# Patient Record
Sex: Female | Born: 1975 | Race: White | Hispanic: No | Marital: Married | Smoking: Never smoker
Health system: Southern US, Community
[De-identification: ages and names within clinical notes are randomized; demographics above are authoritative.]

## PROBLEM LIST (undated history)

## (undated) DIAGNOSIS — N912 Amenorrhea, unspecified: Secondary | ICD-10-CM

## (undated) DIAGNOSIS — R51 Headache: Secondary | ICD-10-CM

## (undated) DIAGNOSIS — F411 Generalized anxiety disorder: Secondary | ICD-10-CM

## (undated) DIAGNOSIS — R42 Dizziness and giddiness: Secondary | ICD-10-CM

## (undated) DIAGNOSIS — J069 Acute upper respiratory infection, unspecified: Secondary | ICD-10-CM

## (undated) DIAGNOSIS — R002 Palpitations: Secondary | ICD-10-CM

## (undated) DIAGNOSIS — K645 Perianal venous thrombosis: Secondary | ICD-10-CM

## (undated) DIAGNOSIS — E039 Hypothyroidism, unspecified: Secondary | ICD-10-CM

## (undated) DIAGNOSIS — E876 Hypokalemia: Secondary | ICD-10-CM

## (undated) DIAGNOSIS — I10 Essential (primary) hypertension: Secondary | ICD-10-CM

## (undated) DIAGNOSIS — F329 Major depressive disorder, single episode, unspecified: Secondary | ICD-10-CM

## (undated) HISTORY — DX: Headache: R51

## (undated) HISTORY — DX: Perianal venous thrombosis: K64.5

## (undated) HISTORY — DX: Hypothyroidism, unspecified: E03.9

## (undated) HISTORY — DX: Palpitations: R00.2

## (undated) HISTORY — DX: Acute upper respiratory infection, unspecified: J06.9

## (undated) HISTORY — DX: Hypokalemia: E87.6

## (undated) HISTORY — PX: OTHER SURGICAL HISTORY: SHX169

## (undated) HISTORY — DX: Essential (primary) hypertension: I10

## (undated) HISTORY — DX: Dizziness and giddiness: R42

## (undated) HISTORY — DX: Amenorrhea, unspecified: N91.2

## (undated) HISTORY — DX: Generalized anxiety disorder: F41.1

## (undated) HISTORY — DX: Major depressive disorder, single episode, unspecified: F32.9

---

## 1993-11-15 HISTORY — PX: TONSILLECTOMY: SUR1361

## 2002-09-12 ENCOUNTER — Other Ambulatory Visit: Admission: RE | Admit: 2002-09-12 | Discharge: 2002-09-12 | Payer: Self-pay | Admitting: Obstetrics and Gynecology

## 2002-12-27 ENCOUNTER — Ambulatory Visit (HOSPITAL_COMMUNITY): Admission: RE | Admit: 2002-12-27 | Discharge: 2002-12-27 | Payer: Self-pay | Admitting: Obstetrics and Gynecology

## 2002-12-27 ENCOUNTER — Encounter: Payer: Self-pay | Admitting: Obstetrics and Gynecology

## 2003-03-20 ENCOUNTER — Inpatient Hospital Stay (HOSPITAL_COMMUNITY): Admission: AD | Admit: 2003-03-20 | Discharge: 2003-03-20 | Payer: Self-pay | Admitting: Obstetrics and Gynecology

## 2003-03-29 ENCOUNTER — Inpatient Hospital Stay (HOSPITAL_COMMUNITY): Admission: AD | Admit: 2003-03-29 | Discharge: 2003-03-31 | Payer: Self-pay | Admitting: *Deleted

## 2003-03-31 ENCOUNTER — Inpatient Hospital Stay (HOSPITAL_COMMUNITY): Admission: AD | Admit: 2003-03-31 | Discharge: 2003-03-31 | Payer: Self-pay | Admitting: *Deleted

## 2003-04-25 ENCOUNTER — Other Ambulatory Visit: Admission: RE | Admit: 2003-04-25 | Discharge: 2003-04-25 | Payer: Self-pay | Admitting: Obstetrics and Gynecology

## 2003-04-30 ENCOUNTER — Encounter: Admission: RE | Admit: 2003-04-30 | Discharge: 2003-05-30 | Payer: Self-pay | Admitting: Obstetrics and Gynecology

## 2007-04-11 ENCOUNTER — Encounter: Payer: Self-pay | Admitting: Endocrinology

## 2007-07-20 ENCOUNTER — Encounter: Payer: Self-pay | Admitting: Endocrinology

## 2007-10-10 ENCOUNTER — Encounter: Payer: Self-pay | Admitting: Endocrinology

## 2007-11-29 ENCOUNTER — Encounter: Payer: Self-pay | Admitting: Endocrinology

## 2007-12-09 ENCOUNTER — Encounter: Payer: Self-pay | Admitting: Endocrinology

## 2008-01-04 ENCOUNTER — Encounter: Payer: Self-pay | Admitting: Endocrinology

## 2008-01-11 ENCOUNTER — Ambulatory Visit: Payer: Self-pay | Admitting: Endocrinology

## 2008-01-11 DIAGNOSIS — F329 Major depressive disorder, single episode, unspecified: Secondary | ICD-10-CM

## 2008-01-11 DIAGNOSIS — E039 Hypothyroidism, unspecified: Secondary | ICD-10-CM | POA: Insufficient documentation

## 2008-01-11 DIAGNOSIS — F3289 Other specified depressive episodes: Secondary | ICD-10-CM | POA: Insufficient documentation

## 2008-01-11 DIAGNOSIS — I10 Essential (primary) hypertension: Secondary | ICD-10-CM | POA: Insufficient documentation

## 2008-01-11 HISTORY — DX: Essential (primary) hypertension: I10

## 2008-01-11 HISTORY — DX: Major depressive disorder, single episode, unspecified: F32.9

## 2008-01-11 HISTORY — DX: Other specified depressive episodes: F32.89

## 2008-01-11 HISTORY — DX: Hypothyroidism, unspecified: E03.9

## 2008-02-05 ENCOUNTER — Ambulatory Visit: Payer: Self-pay | Admitting: Endocrinology

## 2008-02-06 LAB — CONVERTED CEMR LAB
BUN: 7 mg/dL (ref 6–23)
CO2: 29 meq/L (ref 19–32)
Calcium: 9 mg/dL (ref 8.4–10.5)
Chloride: 106 meq/L (ref 96–112)
Creatinine, Ser: 0.6 mg/dL (ref 0.4–1.2)
GFR calc Af Amer: 148 mL/min
GFR calc non Af Amer: 122 mL/min
Glucose, Bld: 92 mg/dL (ref 70–99)
Potassium: 4.3 meq/L (ref 3.5–5.1)
Sodium: 143 meq/L (ref 135–145)
TSH: 2.34 microintl units/mL (ref 0.35–5.50)

## 2008-03-26 ENCOUNTER — Ambulatory Visit: Payer: Self-pay | Admitting: Endocrinology

## 2008-03-29 ENCOUNTER — Telehealth: Payer: Self-pay | Admitting: Endocrinology

## 2008-03-29 ENCOUNTER — Ambulatory Visit: Payer: Self-pay | Admitting: Endocrinology

## 2008-03-29 LAB — CONVERTED CEMR LAB
BUN: 8 mg/dL (ref 6–23)
CO2: 30 meq/L (ref 19–32)
Calcium: 8.8 mg/dL (ref 8.4–10.5)
Chloride: 109 meq/L (ref 96–112)
Creatinine, Ser: 0.8 mg/dL (ref 0.4–1.2)
Free T4: 1.1 ng/dL (ref 0.6–1.6)
GFR calc Af Amer: 106 mL/min
GFR calc non Af Amer: 88 mL/min
Glucose, Bld: 79 mg/dL (ref 70–99)
Potassium: 4.4 meq/L (ref 3.5–5.1)
Sodium: 143 meq/L (ref 135–145)
TSH: 1.37 microintl units/mL (ref 0.35–5.50)

## 2008-04-02 ENCOUNTER — Ambulatory Visit: Payer: Self-pay | Admitting: Endocrinology

## 2008-04-02 DIAGNOSIS — R51 Headache: Secondary | ICD-10-CM | POA: Insufficient documentation

## 2008-04-02 DIAGNOSIS — R519 Headache, unspecified: Secondary | ICD-10-CM | POA: Insufficient documentation

## 2008-04-02 DIAGNOSIS — R42 Dizziness and giddiness: Secondary | ICD-10-CM

## 2008-04-02 DIAGNOSIS — R002 Palpitations: Secondary | ICD-10-CM | POA: Insufficient documentation

## 2008-04-02 HISTORY — DX: Dizziness and giddiness: R42

## 2008-04-02 HISTORY — DX: Headache: R51

## 2008-04-02 HISTORY — DX: Palpitations: R00.2

## 2008-04-11 ENCOUNTER — Encounter: Payer: Self-pay | Admitting: Endocrinology

## 2008-04-11 ENCOUNTER — Ambulatory Visit: Payer: Self-pay

## 2008-04-12 ENCOUNTER — Telehealth: Payer: Self-pay | Admitting: Endocrinology

## 2008-04-15 ENCOUNTER — Telehealth: Payer: Self-pay | Admitting: Endocrinology

## 2008-04-17 ENCOUNTER — Encounter: Payer: Self-pay | Admitting: Endocrinology

## 2008-04-18 ENCOUNTER — Telehealth: Payer: Self-pay | Admitting: Internal Medicine

## 2008-04-20 LAB — CONVERTED CEMR LAB
Catecholamines Tot(E+NE) 24 Hr U: 0.052 mg/24hr
Dopamine 24 Hr Urine: 276 mcg/24hr (ref <500)
Epinephrine 24 Hr Urine: 3 mcg/24hr (ref <20)
Metaneph Total, Ur: 374 ug/24hr (ref 115–695)
Metanephrines, Ur: 142 (ref 36–190)
Norepinephrine 24 Hr Urine: 49 mcg/24hr (ref <80)
Normetanephrine, 24H Ur: 232 (ref 35–482)

## 2008-04-22 ENCOUNTER — Ambulatory Visit: Payer: Self-pay | Admitting: Endocrinology

## 2008-04-22 DIAGNOSIS — E876 Hypokalemia: Secondary | ICD-10-CM

## 2008-04-22 HISTORY — DX: Hypokalemia: E87.6

## 2008-04-30 LAB — CONVERTED CEMR LAB
Aldosterone, Serum: 2
PRA: 0.4

## 2008-05-06 ENCOUNTER — Encounter: Payer: Self-pay | Admitting: Endocrinology

## 2008-05-16 ENCOUNTER — Ambulatory Visit: Payer: Self-pay | Admitting: Endocrinology

## 2008-05-16 ENCOUNTER — Encounter (INDEPENDENT_AMBULATORY_CARE_PROVIDER_SITE_OTHER): Payer: Self-pay | Admitting: *Deleted

## 2008-06-21 ENCOUNTER — Ambulatory Visit: Payer: Self-pay | Admitting: Internal Medicine

## 2008-06-21 ENCOUNTER — Encounter: Payer: Self-pay | Admitting: Endocrinology

## 2008-07-26 ENCOUNTER — Ambulatory Visit: Payer: Self-pay | Admitting: Internal Medicine

## 2008-10-16 ENCOUNTER — Ambulatory Visit: Payer: Self-pay | Admitting: Internal Medicine

## 2008-10-17 LAB — CONVERTED CEMR LAB
ALT: 12 units/L (ref 0–35)
Basophils Absolute: 0.1 10*3/uL (ref 0.0–0.1)
Bilirubin Urine: NEGATIVE
Bilirubin, Direct: 0.1 mg/dL (ref 0.0–0.3)
CO2: 29 meq/L (ref 19–32)
Calcium: 8.9 mg/dL (ref 8.4–10.5)
Chloride: 106 meq/L (ref 96–112)
Creatinine, Ser: 0.7 mg/dL (ref 0.4–1.2)
Eosinophils Absolute: 0.1 10*3/uL (ref 0.0–0.7)
GFR calc non Af Amer: 102 mL/min
HDL: 47.1 mg/dL (ref >39.0)
Ketones, ur: NEGATIVE mg/dL
LDL Cholesterol: 106 mg/dL — ABNORMAL HIGH (ref 0–99)
Lymphocytes Relative: 23.4 % (ref 12.0–46.0)
MCHC: 34.6 g/dL (ref 30.0–36.0)
MCV: 89.5 fL (ref 78.0–100.0)
Mucus, UA: NEGATIVE
Neutrophils Relative %: 67.3 % (ref 43.0–77.0)
RDW: 12.2 % (ref 11.5–14.6)
Sodium: 141 meq/L (ref 135–145)
TSH: 1.72 microintl units/mL (ref 0.35–5.50)
Total Bilirubin: 0.8 mg/dL (ref 0.3–1.2)
Total CHOL/HDL Ratio: 3.5
Triglycerides: 63 mg/dL (ref 0–149)
Urobilinogen, UA: 0.2 (ref 0.0–1.0)
VLDL: 13 mg/dL (ref 0–40)

## 2008-10-18 ENCOUNTER — Ambulatory Visit: Payer: Self-pay | Admitting: Internal Medicine

## 2008-10-18 DIAGNOSIS — F411 Generalized anxiety disorder: Secondary | ICD-10-CM | POA: Insufficient documentation

## 2008-10-18 DIAGNOSIS — J069 Acute upper respiratory infection, unspecified: Secondary | ICD-10-CM

## 2008-10-18 DIAGNOSIS — N912 Amenorrhea, unspecified: Secondary | ICD-10-CM

## 2008-10-18 HISTORY — DX: Acute upper respiratory infection, unspecified: J06.9

## 2008-10-18 HISTORY — DX: Generalized anxiety disorder: F41.1

## 2008-10-18 HISTORY — DX: Amenorrhea, unspecified: N91.2

## 2008-10-18 LAB — CONVERTED CEMR LAB: Prolactin: 13.4 ng/mL

## 2008-11-05 LAB — CONVERTED CEMR LAB: Progesterone: 1.8 ng/mL

## 2008-12-06 ENCOUNTER — Telehealth: Payer: Self-pay | Admitting: Endocrinology

## 2009-06-12 ENCOUNTER — Ambulatory Visit: Payer: Self-pay | Admitting: Internal Medicine

## 2009-06-12 ENCOUNTER — Telehealth: Payer: Self-pay | Admitting: Internal Medicine

## 2009-06-12 LAB — CONVERTED CEMR LAB: TSH: 1.23 microintl units/mL (ref 0.35–5.50)

## 2009-06-17 ENCOUNTER — Ambulatory Visit: Payer: Self-pay | Admitting: Endocrinology

## 2009-08-01 ENCOUNTER — Encounter (INDEPENDENT_AMBULATORY_CARE_PROVIDER_SITE_OTHER): Payer: Self-pay | Admitting: *Deleted

## 2009-08-22 ENCOUNTER — Ambulatory Visit: Payer: Self-pay | Admitting: Endocrinology

## 2009-08-22 LAB — CONVERTED CEMR LAB: Vit D, 25-Hydroxy: 29 ng/mL — ABNORMAL LOW (ref 30–89)

## 2009-08-26 ENCOUNTER — Telehealth: Payer: Self-pay | Admitting: Endocrinology

## 2009-08-26 LAB — CONVERTED CEMR LAB
BUN: 10 mg/dL (ref 6–23)
Calcium: 9 mg/dL (ref 8.4–10.5)
Creatinine, Ser: 0.8 mg/dL (ref 0.4–1.2)
GFR calc non Af Amer: 86.74 mL/min (ref >60)
Glucose, Bld: 102 mg/dL — ABNORMAL HIGH (ref 70–99)
Potassium: 4.5 meq/L (ref 3.5–5.1)

## 2009-08-27 ENCOUNTER — Telehealth: Payer: Self-pay | Admitting: Endocrinology

## 2009-08-29 ENCOUNTER — Ambulatory Visit: Payer: Self-pay | Admitting: Internal Medicine

## 2009-08-29 DIAGNOSIS — K645 Perianal venous thrombosis: Secondary | ICD-10-CM | POA: Insufficient documentation

## 2009-08-29 HISTORY — DX: Perianal venous thrombosis: K64.5

## 2009-09-01 ENCOUNTER — Telehealth: Payer: Self-pay | Admitting: Internal Medicine

## 2009-09-05 ENCOUNTER — Encounter: Payer: Self-pay | Admitting: Internal Medicine

## 2009-09-09 ENCOUNTER — Encounter (INDEPENDENT_AMBULATORY_CARE_PROVIDER_SITE_OTHER): Payer: Self-pay | Admitting: *Deleted

## 2009-10-21 ENCOUNTER — Ambulatory Visit: Payer: Self-pay | Admitting: Internal Medicine

## 2009-10-21 LAB — CONVERTED CEMR LAB
ALT: 14 units/L (ref 0–35)
AST: 17 units/L (ref 0–37)
Albumin: 4 g/dL (ref 3.5–5.2)
BUN: 10 mg/dL (ref 6–23)
Basophils Absolute: 0 10*3/uL (ref 0.0–0.1)
Bilirubin Urine: NEGATIVE
CO2: 27 meq/L (ref 19–32)
Chloride: 107 meq/L (ref 96–112)
Cholesterol: 131 mg/dL (ref 0–200)
GFR calc non Af Amer: 86.66 mL/min (ref >60)
Glucose, Bld: 102 mg/dL — ABNORMAL HIGH (ref 70–99)
HCT: 44.2 % (ref 36.0–46.0)
Hemoglobin: 15.2 g/dL — ABNORMAL HIGH (ref 12.0–15.0)
Ketones, ur: NEGATIVE mg/dL
Leukocytes, UA: NEGATIVE
Lymphs Abs: 1.5 10*3/uL (ref 0.7–4.0)
MCV: 92 fL (ref 78.0–100.0)
Monocytes Absolute: 0.3 10*3/uL (ref 0.1–1.0)
Monocytes Relative: 6.4 % (ref 3.0–12.0)
Neutro Abs: 3.4 10*3/uL (ref 1.4–7.7)
Nitrite: NEGATIVE
Platelets: 173 10*3/uL (ref 150.0–400.0)
Potassium: 4.1 meq/L (ref 3.5–5.1)
RDW: 12.4 % (ref 11.5–14.6)
Sodium: 140 meq/L (ref 135–145)
Specific Gravity, Urine: >=1.03 (ref 1.000–1.030)
TSH: 1.04 microintl units/mL (ref 0.35–5.50)
Total Bilirubin: 1.1 mg/dL (ref 0.3–1.2)
Urobilinogen, UA: 0.2 (ref 0.0–1.0)

## 2009-10-24 ENCOUNTER — Ambulatory Visit: Payer: Self-pay | Admitting: Internal Medicine

## 2009-11-13 ENCOUNTER — Ambulatory Visit: Payer: Self-pay | Admitting: Internal Medicine

## 2010-02-10 ENCOUNTER — Encounter: Admission: RE | Admit: 2010-02-10 | Discharge: 2010-02-10 | Payer: Self-pay | Admitting: Family Medicine

## 2010-09-10 ENCOUNTER — Ambulatory Visit: Payer: Self-pay | Admitting: Internal Medicine

## 2010-10-19 ENCOUNTER — Encounter: Admission: RE | Admit: 2010-10-19 | Discharge: 2010-10-19 | Payer: Self-pay | Admitting: Endocrinology

## 2010-12-13 LAB — CONVERTED CEMR LAB: Sed Rate: 21 mm/hr (ref 0–22)

## 2010-12-17 NOTE — Assessment & Plan Note (Signed)
Summary: SORE THROAT/NWS   Vital Signs:  Patient profile:   35 year old female Height:      70 inches Weight:      166 pounds BMI:     23.90 O2 Sat:      98 % on Room air Temp:     98.6 degrees F oral Pulse rate:   58 / minute BP sitting:   120 / 84  (left arm) Cuff size:   regular  Vitals Entered By: Zella Ball Ewing CMA Duncan Dull) (September 10, 2010 9:38 AM)  O2 Flow:  Room air CC: Sore throat, fever, fatigue/RE   CC:  Sore throat, fever, and fatigue/RE.  History of Present Illness: here to f/u;  overall doing ok except for 2 days onset mod to severe ST with fever, slight cough, headache, malase and general weakness, but no sinus pain, cough, and Pt denies CP, worsening sob, doe, wheezing, orthopnea, pnd, worsening LE edema, palps, dizziness or syncope .  Pt denies new neuro symptoms such as headache, facial or extremity weakness  Pt denies polydipsia, polyuria.  Overall good compliance with current meds though no longer taking the triazolam for sleep, and the armour thyroid in place of the synthorid, trying to follow low chol diet, wt stable on current thyroid med and denies hyper or hypothyroid symptoms such as voice, skin or wt change.  Denies worsening depressive symptoms, suicidal ideation, or panic.  Of note both husband and daughter on antibx for proven strep throat.  Preventive Screening-Counseling & Management      Drug Use:  no.    Problems Prior to Update: 1)  Pharyngitis-acute  (ICD-462) 2)  Hemorrhoid, External, Thrombosed  (ICD-455.4) 3)  Uri  (ICD-465.9) 4)  Amenorrhea  (ICD-626.0) 5)  Preventive Health Care  (ICD-V70.0) 6)  Anxiety  (ICD-300.00) 7)  Hypokalemia  (ICD-276.8) 8)  Dizziness  (ICD-780.4) 9)  Headache  (ICD-784.0) 10)  Palpitations  (ICD-785.1) 11)  Chickenpox, Hx of  (ICD-V15.9) 12)  Hypothyroidism  (ICD-244.9) 13)  Hypertension  (ICD-401.9) 14)  Depression  (ICD-311)  Medications Prior to Update: 1)  Super Probiotic   Caps (Misc Intestinal Flora  Regulat) .... Take Qd 2)  Multi-Vitamin/minerals  Tabs (Multiple Vitamins-Minerals) .Marland Kitchen.. 1 Daily 3)  Fish Oil 500 Mg Caps (Omega-3 Fatty Acids) .Marland Kitchen.. 1 Daily 4)  Triazolam 0.25 Mg Tabs (Triazolam) .Marland Kitchen.. 1 As Needed Anxiety 5)  Levothyroxine Sodium 75 Mcg Tabs (Levothyroxine Sodium) .Marland Kitchen.. 1 By Mouth Once Daily 6)  Lidocaine-Hydrocortisone Ace 3-0.5 % Crea (Lidocaine-Hydrocortisone Ace) .... Use Asd Two Times A Day As Needed  Current Medications (verified): 1)  Multi-Vitamin/minerals  Tabs (Multiple Vitamins-Minerals) .Marland Kitchen.. 1 Daily 2)  Fish Oil 500 Mg Caps (Omega-3 Fatty Acids) .Marland Kitchen.. 1 Daily 3)  Armour Thyroid Tabs (Thyroid) .... 45mg  Every Morning and 30mg  in The Evening 4)  Amoxicillin 500 Mg Caps (Amoxicillin) .... 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  , mild ill  Head:  normocephalic and atraumatic.   Eyes:  vision grossly intact, pupils equal, and pupils round.   Ears:  bilat tms' mild red, sinus nontender Nose:  nasal dischargemucosal pallor and mucosal edema.   Mouth:  pharyngeal erythema and pharyngeal exudate.   Neck:  supple and cervical lymphadenopathy.   Lungs:  normal respiratory effort and normal breath  sounds.   Heart:  normal rate and regular rhythm.   Extremities:  no edema, no erythema  Psych:  not anxious appearing and not depressed appearing.     Impression & Recommendations:  Problem # 1:  PHARYNGITIS-ACUTE (ICD-462)  Her updated medication list for this problem includes:    Amoxicillin 500 Mg Caps (Amoxicillin) .Marland Kitchen... 2 by mouth two times a day treat as above, f/u any worsening signs or symptoms   Problem # 2:  HYPERTENSION (ICD-401.9)  BP today: 120/84 Prior BP: 114/72 (11/13/2009)  Labs Reviewed: K+: 4.1 (10/21/2009) Creat: : 0.8 (10/21/2009)   Chol: 131 (10/21/2009)   HDL: 38.20 (10/21/2009)   LDL: 83 (10/21/2009)   TG: 49.0 (10/21/2009) stable overall by hx and exam, ok to continue meds/tx as is  - no meds needed at this time  Problem # 3:  DEPRESSION (ICD-311) stable overall by hx and exam, ok to continue meds/tx as is - no meds needed at this time  Complete Medication List: 1)  Multi-vitamin/minerals Tabs (Multiple vitamins-minerals) .Marland Kitchen.. 1 daily 2)  Fish Oil 500 Mg Caps (Omega-3 fatty acids) .Marland Kitchen.. 1 daily 3)  Armour Thyroid Tabs (thyroid)  .... 45mg  every morning and 30mg  in the evening 4)  Amoxicillin 500 Mg Caps (Amoxicillin) .... 2 by mouth two times a day  Patient Instructions: 1)  Please take all new medications as prescribed 2)  Continue all previous medications as before this visit  3)  Please schedule a follow-up appointment as needed. Prescriptions: AMOXICILLIN 500 MG CAPS (AMOXICILLIN) 2 by mouth two times a day  #40 x 0   Entered and Authorized by:   Corwin Levins MD   Signed by:   Corwin Levins MD on 09/10/2010   Method used:   Print then Give to Patient   RxID:   [id number]    Orders Added: 1)  Est. Patient Level IV [56213]

## 2011-03-05 IMAGING — US US SOFT TISSUE HEAD/NECK
1 series · 14 of 25 positions shown · non-contrast
Comparison: None.

CLINICAL DATA: Enlarged thyroid on physical exam.

THYROID ULTRASOUND
TECHNIQUE: Ultrasound examination of the thyroid gland and
adjacent soft tissues was performed.

[Series 1: us soft tissue head/neck · 0.10mm/px · 14 of 39 slices shown]
[im 1/39]
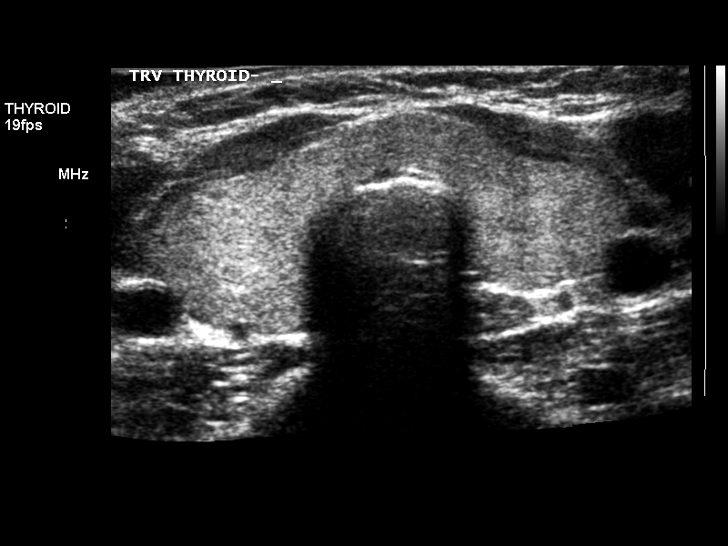
[im 4/39]
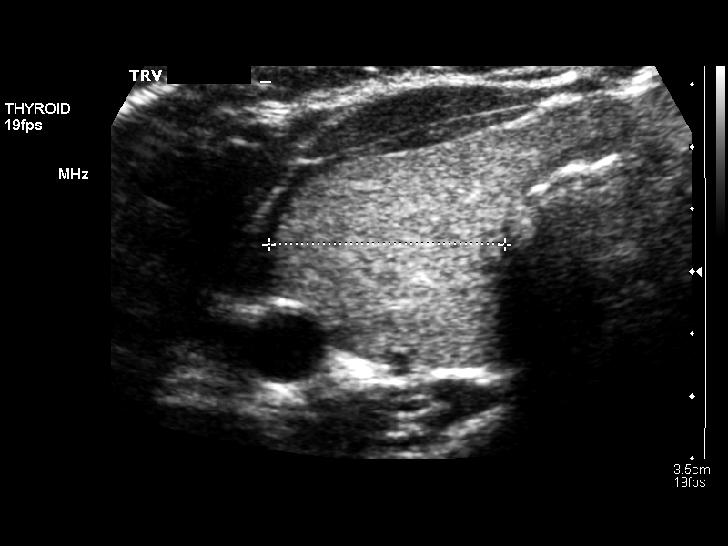
[im 7/39]
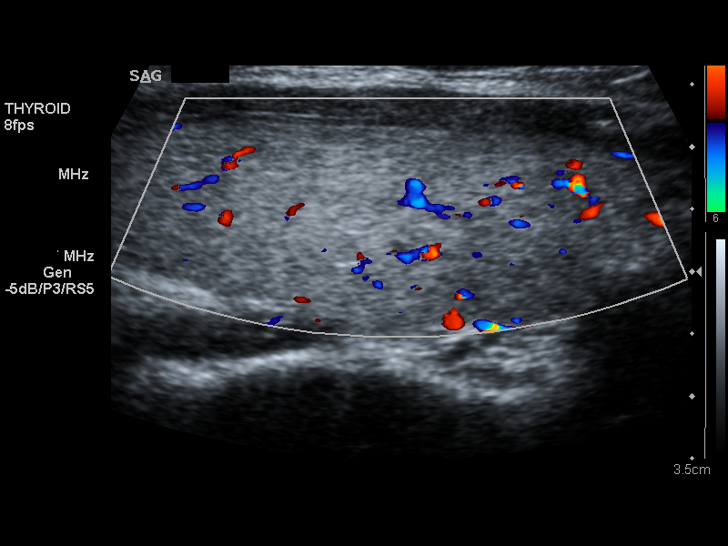
[im 10/39]
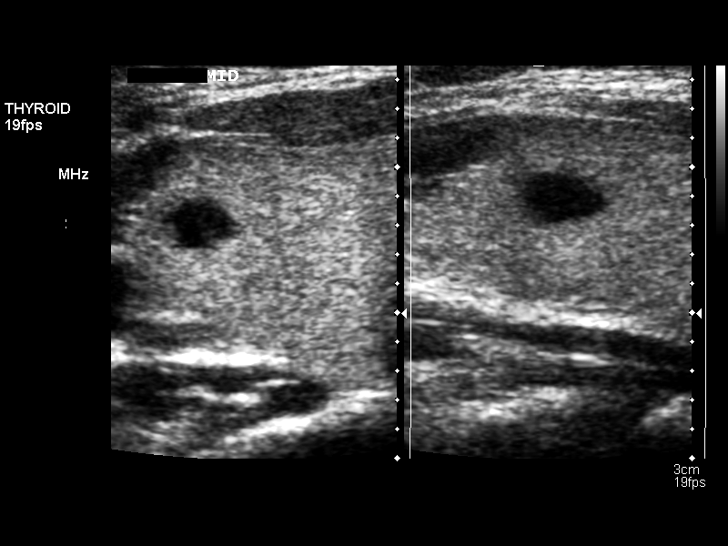
[im 13/39]
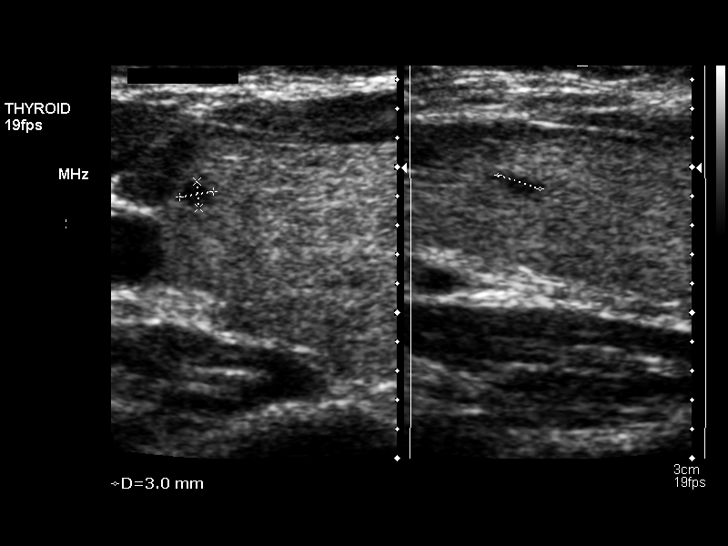
[im 15/39]
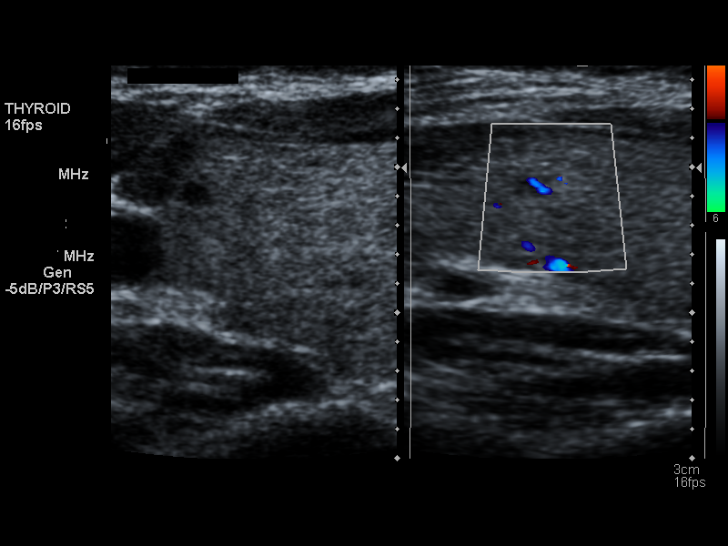
[im 18/39]
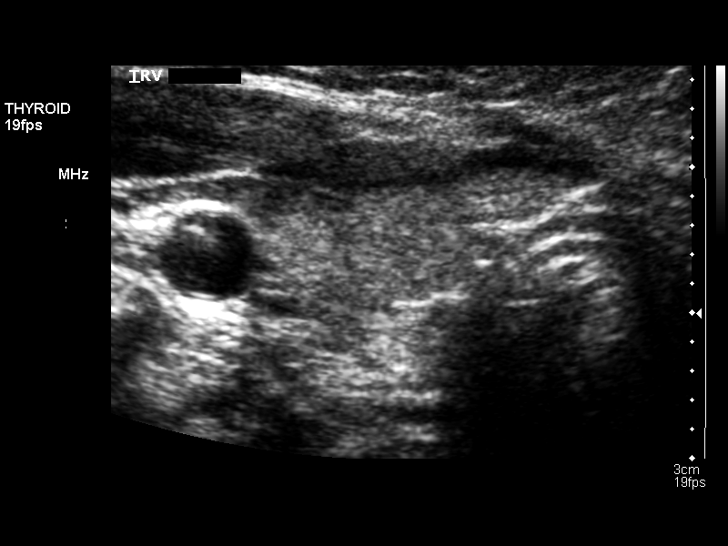
[im 21/39]
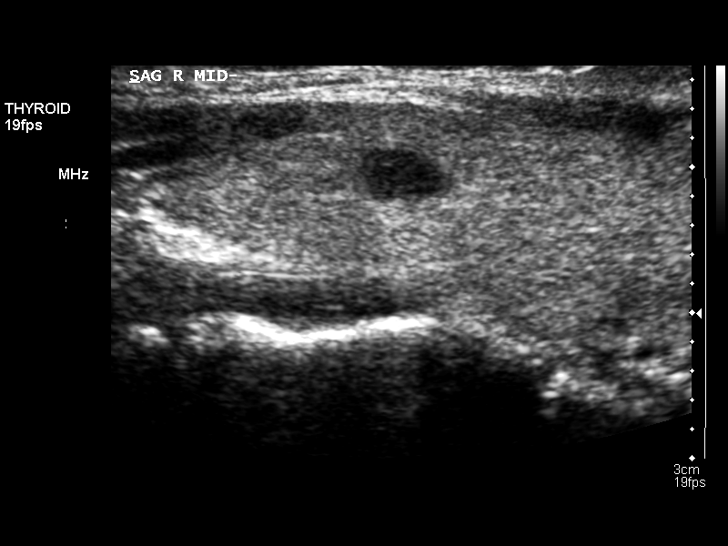
[im 24/39]
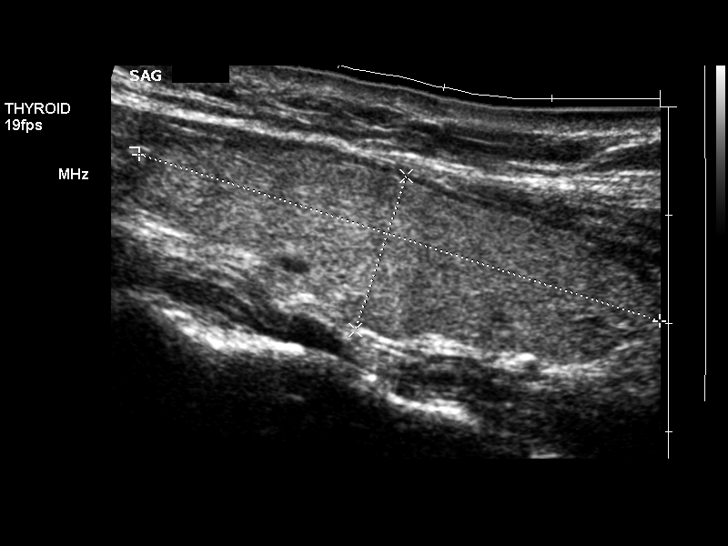
[im 26/39]
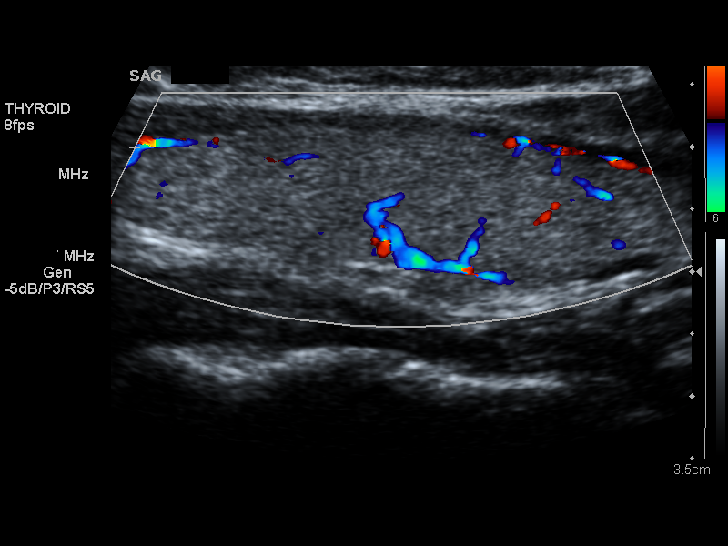
[im 29/39]
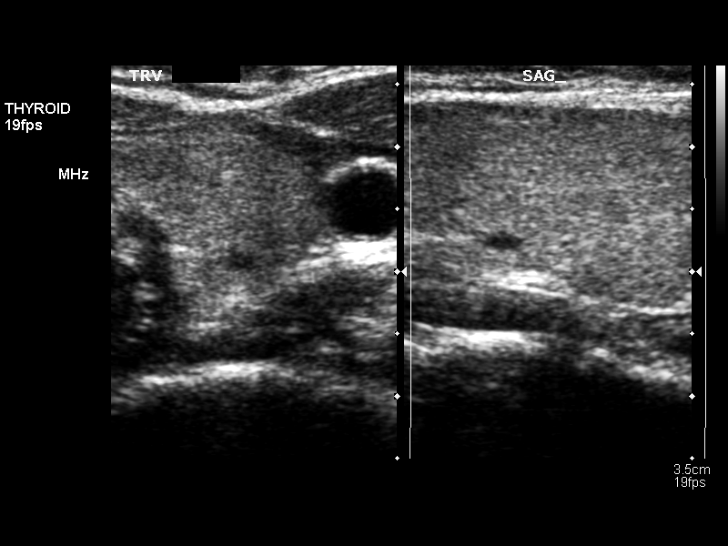
[im 32/39]
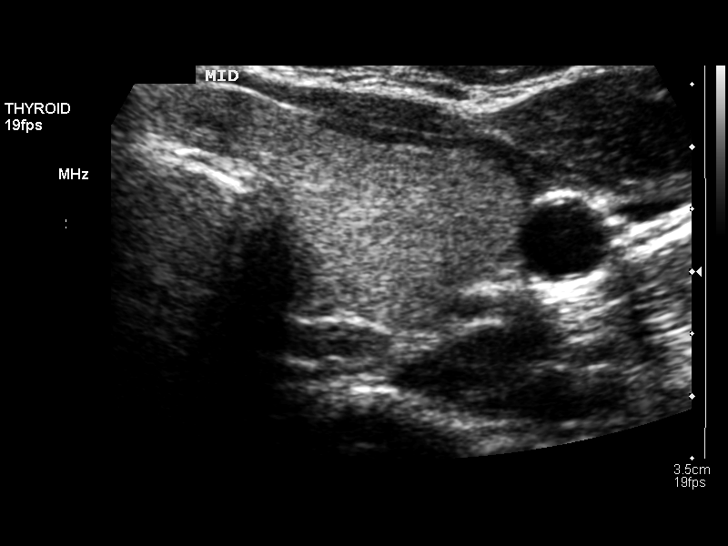
[im 35/39]
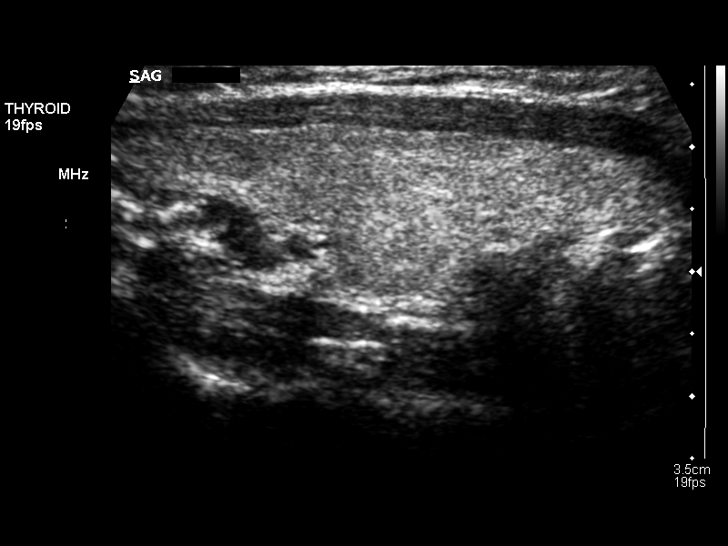
[im 39/39]
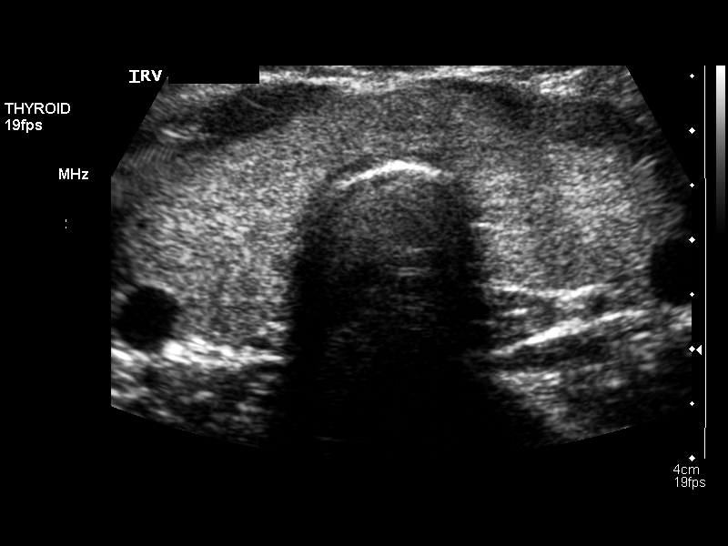

[14 of 25 positions shown; findings below may reference images not displayed]

FINDINGS: Right thyroid lobe measures 4.8 x 1.7 x 1.9 cm.  Left
thyroid lobe measures 5.0 x 1.5 x 2.0 cm.  Isthmus is 8 mm.

Small sub centimeter hypoechoic nodules seen bilaterally.  6 mm
hypoechoic nodule in the right mid thyroid.  Adjacent 3 mm
hypoechoic nodule.  4 mm hypoechoic nodule in the left upper lobe.
IMPRESSION: Small subcentimeteric thyroid nodules.

## 2011-03-30 NOTE — Assessment & Plan Note (Signed)
[facility]                         ELECTROPHYSIOLOGY OFFICE NOTE   STEFAN, KAREN                     MRN:          [id number]  DATE:07/26/2008                            DOB:          23-Mar-1976    Ms. Gengler returns today for followup.  She is a very pleasant young  woman who has a history of palpitations and panic attacks and was  referred to me by Dr. Everardo All several weeks ago.  We started her  initially on low-dose beta-blocker therapy and with gradual up-titration  so that she is now on 25 mg twice daily.  In all-in-all, she has proved.  She notes that it was difficult initiating the beta-blockers secondary  to fatigue, but however, after several days each time her dose was  increased, her symptoms improved.  All-in-all, she is better.  Her panic  attacks are much better and her palpitations are much much better.   CURRENT MEDICATIONS:  1. Metoprolol 25 twice a day.  2. Synthroid 80 mcg daily.   PHYSICAL EXAMINATION:  GENERAL:  Notable for her being a pleasant but  morbidly obese young woman in no distress.  VITAL SIGNS:  Blood pressure was 116/90.  The pulse was 80 and regular.  Respirations were 18.  The weight was 318 pounds.  NECK:  No jugular distention.  LUNGS:  Clear bilaterally to auscultation.  No wheezes, rales, or  rhonchi are present.  CARDIOVASCULAR:  Regular rate and rhythm.  Normal S1 and S2.  The heart  sounds, however was somewhat distant.  ABDOMEN:  Obese and nontender.  EXTREMITIES:  No peripheral edema.   IMPRESSION:  1. Symptomatic tachy palpitations.  2. Panic attacks.  3. Morbid obesity.   DISCUSSION:  I have recommend that we continue Ms. Espey on 25 twice a  day of beta-blocker Lopressor.  We could certainly increase her dose  more if she had more symptoms of palpitations, but I am concerned about  fatigue and weakness on this drug.  The patient will see [place] back in the  office in approximately 6  months.     Doylene Canning. Ladona Ridgel, MD  Electronically Signed    GWT/MedQ  DD: 07/26/2008  DT: 07/27/2008  Job #: [id number]   cc:   Gregary Signs A. Everardo All, MD

## 2011-03-30 NOTE — Letter (Signed)
June 21, 2008    Brooke Peters All, MD  [address]  [place], [place] [address]   RE:  Brooke Peters, Brooke Peters  MRN:  [id number]  /  DOB:  [birth year]   Dear Gregary Signs,   Thank you for referring Brooke Peters for EP evaluation.  As you  know, she is a very pleasant and morbidly obese young woman with a  history of panic attacks for a year and symptoms of palpitations and  heart pounding, which have been present for at least a year as well.  The patient has had 2 children, the last one just a year ago.  She is  still breast feeding.  The patient has a history of hypothyroidism and  is on Synthroid under your direction.  Her history is, at times, she  will become anxious and feels dizzy and lightheaded and feels like her  heart is beating harder than it should be, and also faster than it  should.  She has never had frank syncope but feels like if she does not  lie down or sit down, she might actually pass out.  She has worn a  Holter monitor which demonstrates sinus rhythm and PVCs, but otherwise  was unremarkable.  She also notes when she experiences these symptoms  that she will feel anxious and have chest pain, which will eventually  resolve.  Following these spells, she feels weak but eventually get  better.  It sounds like, she has had catecholamine measurements, which  were unrevealing.   PAST MEDICAL HISTORY:  Notable for tonsillectomy and morbid obesity.   SOCIAL HISTORY:  The patient is married.  She has 2 children.  She  denies tobacco or ethanol abuse.  She works as an Pensions consultant.   FAMILY HISTORY:  Notable for her mother dying at the age 75 of lung  cancer and metastasized to the brain.  Her father is still living at age  66.   MEDICATIONS:  Synthroid 88 mcg daily.   REVIEW OF SYSTEMS:  Notable for constipation, generalized fatigue, and  weakness and anxiety and panic attacks.  Rest of her review of systems  were negative.   PHYSICAL EXAMINATION:  GENERAL:  She is a  pleasant and morbidly obese  young woman in no acute distress.  VITAL SIGNS:  Her blood pressure today was 122/80, the pulse was 100 and  regular, respirations were 18, and her weight was 315 pounds.  Orthostatic vitals were obtained and were quite revealing.  Lying, she  had a heart rate of 90 and blood pressure of 96/70 and standing,  initially the heart rate increased to 106, the blood pressure went up to  132/90 and after 2 minutes, the patient's heart rate was 110, her blood  pressure increased to 160/90, and she became woozy and felt like she  might pass out.  She felt anxious like she is about to have a panic  attack.  After lying down, she improved.  HEENT:  Normocephalic and atraumatic.  Pupils are equal and round.  Oropharynx moist.  Sclerae anicteric.  NECK:  No jugular venous distention.  There is no thyromegaly.  Trachea  is midline.  The Carotids are 2+ and symmetric.  LUNGS:  Clear bilaterally on auscultation.  No wheezes, rales, or  rhonchi are present.  CARDIOVASCULAR:  Distant with a regular rate and rhythm and normal S1  and S2.  The PMI could not be appreciated secondary to morbid obesity.  ABDOMEN:  Morbidly obese,  soft, nontender, and nondistended.  There is  no obvious organomegaly though it was difficult to discern.  EXTREMITIES:  Demonstrated no obvious peripheral edema.  There is no  cyanosis or clubbing.  NEUROLOGIC:  Alert and oriented x3 with cranial nerves intact.  Strength  is 5/5 and symmetric.   Her previous EKG demonstrates sinus rhythm with normal axis and  intervals.   IMPRESSION:  1. Palpitations, which are likely due to sinus tachycardia.  2. Panic attacks, unclear whether this is related to #1.  3. Morbid obesity.  4. Thyroid disorder.   DISCUSSION:  Ms. Ades appears to be hyper-catecholaminergic with  upright posture and in periods of anxiety.  I would recommend that we  try her on a low dose of beta blocker, and I have given a  prescription  today to start with 12.5 twice daily and metoprolol increasing to 25  twice daily over the next couple of weeks.  Ultimately, we will plan on  seeing her back in the office, and she may require up titration of these  medications or a combination therapy.  The patient does have a question  about beta blockers in breast milk and I think that it would be okay for  her to continue breast feeding, but we will plan on following this.  My  experience has been that 25 twice a day of metoprolol would not provide  sufficient amount of beta blocker in the breast milk  to cause any bradycardia for baby.  The baby is now 67 year old, and I  have also encouraged her about perhaps weaning herself off breast  feeding.   Gregary Signs, thank you for referring Brooke Peters.  I will plan to see her back  in a month or two.    Sincerely,      Doylene Canning. Ladona Ridgel, MD  Electronically Signed    GWT/MedQ  DD: 06/21/2008  DT: 06/22/2008  Job #: [id number]

## 2011-04-02 NOTE — Consult Note (Signed)
   NAMECHERRISH, Brooke Peters                        ACCOUNT NO.:  [id number]   MEDICAL RECORD NO.:  [id number]                   PATIENT TYPE:  MAT   LOCATION:  [facility]                                 FACILITY:  [facility]   PHYSICIAN:  Lenoard Aden, M.D.             DATE OF BIRTH:  Jun 30, 1976   DATE OF CONSULTATION:  03/20/2003  DATE OF DISCHARGE:                                   CONSULTATION   EMERGENCY ROOM OBSTETRICAL CONSULTATION:   CHIEF COMPLAINT:  Elevated blood pressure.   HISTORY OF PRESENT ILLNESS:  The patient is a 35 year old white female G2,  P0, EDD 04/12/2003 at 36-plus weeks who presented to the office with blood  pressures at home of 170-180/100-110, no proteinuria in the office, for  further evaluation and reassuring but nonreactive NST noted.   MEDICATIONS:  Prenatal vitamins.   ALLERGIES:  No known drug allergies.   MEDICAL HISTORY:  History of SAB in July 2003; no other medical  hospitalizations; history of tonsillectomy and wisdom tooth removal.   FAMILY HISTORY:  Hypertension, hypercholesterolemia, stroke, lung, bone  cancer and juvenile insulin-dependent diabetes.   PRENATAL LABORATORY DATA:  Within normal limits.   CURRENT PREGNANCY:  Pregnancy complicated by borderline elevation of blood  pressure.  Body mass index greater than 35 and size-dates discrepancy with  an AGA fetus.   PHYSICAL EXAMINATION:  GENERAL:  She is a well-developed, well-nourished  white female; no apparent distress.  VITAL SIGNS:  Blood pressure 152/78.  HEENT:  Normal.  LUNGS:  Clear.  HEART:  Regular rhythm.  ABDOMEN:  Soft, gravid, nontender.  CERVIX:  Closed and long and -2.  EXTREMITIES:  DTRs 2 to 3+ with no evidence of clonus, 1+ pretibial edema  noted.   LABORATORIES:  PIH labs are pending.   IMPRESSION:  1. Rule out gestational hypertension versus preeclampsia.  2. History of reassuring but nonreactive nonstress test now reactive.    PLAN:  Check PIH labs, serial  blood pressure monitoring, discharge home if  normal laboratories and normalization of blood pressure is noted.   ADDENDUM:  NST is reactive with good accelerations; no decelerations and no  contractions.                                               Lenoard Aden, M.D.    RJT/MEDQ  D:  03/20/2003  T:  03/20/2003  Job:  [id number]

## 2011-04-02 NOTE — H&P (Signed)
   NAMEBEVERLYANN, Brooke Peters                        ACCOUNT NO.:  [id number]   MEDICAL RECORD NO.:  [id number]                   PATIENT TYPE:   LOCATION:                                       FACILITY:  [facility]   PHYSICIAN:  Lenoard Aden, M.D.             DATE OF BIRTH:  03-Dec-1975   DATE OF ADMISSION:  03/29/2003  DATE OF DISCHARGE:                                HISTORY & PHYSICAL   CHIEF COMPLAINT:  Chronic versus gestational hypertension.   HISTORY OF PRESENT ILLNESS:  A 35 year old white female G2 P0; EDD Apr 12, 2003 at [redacted] weeks gestation with blood pressure 154/98 in the office.  History of normal PIH labs on Mar 26, 2003.  The patient denies headaches,  visual symptoms, or epigastric pain.   PAST OBSTETRICAL HISTORY:  Remarkable for a complete SAB in July 2003.   ALLERGIES:  No known drug allergies.   MEDICATIONS:  Prenatal vitamins.   PAST MEDICAL HISTORY:  The patient has a history of recurrent UTIs with  urethral dilatation.  No other medical or surgical hospitalizations.   FAMILY HISTORY:  Stroke, hypertension, juvenile diabetes, and unspecific  cancer.   SURGICAL HISTORY:  Remarkable for tonsillectomy and wisdom tooth removal.   PRENATAL LABORATORY DATA:  Blood type O positive, Rh antibody negative.  Rubella immune.  Hepatitis B surface antigen negative.  GC and chlamydia are  negative.  GBS is positive.   PHYSICAL EXAMINATION:  GENERAL:  She is a well-developed, well-nourished  white female in no acute distress.  HEENT:  Normal.  LUNGS:  Clear.  HEART:  Regular rhythm.  ABDOMEN:  Soft, gravid, nontender.  Estimated fetal weight one week ago was  6.5 pounds.  PELVIC:  Cervix is 2 cm, thick, vertex, -2.  EXTREMITIES:  Reveal no cords.  NEUROLOGIC:  Nonfocal.  DTRs 2+.  No evidence of clonus.   IMPRESSION:  1. Thirty-eight-week intrauterine pregnancy.  2. Chronic hypertension versus gestational hypertension with exacerbation;     no stigmata of  preeclampsia.    PLAN:  To [facility] for CBC, PIH labs, Pitocin induction, possible  cervical ripening, possible magnesium prophylaxis.  The patient acknowledges  and will proceed.                                               Lenoard Aden, M.D.    RJT/MEDQ  D:  03/29/2003  T:  03/29/2003  Job:  [id number]

## 2011-08-10 ENCOUNTER — Other Ambulatory Visit: Payer: Self-pay | Admitting: Chiropractic Medicine

## 2011-08-10 ENCOUNTER — Ambulatory Visit
Admission: RE | Admit: 2011-08-10 | Discharge: 2011-08-10 | Disposition: A | Discharge status: Home / Self Care | Payer: 59 | Source: Ambulatory Visit | Attending: *Deleted | Admitting: *Deleted

## 2011-08-10 DIAGNOSIS — T148XXA Other injury of unspecified body region, initial encounter: Secondary | ICD-10-CM

## 2011-08-10 DIAGNOSIS — S139XXA Sprain of joints and ligaments of unspecified parts of neck, initial encounter: Secondary | ICD-10-CM

## 2011-11-10 ENCOUNTER — Encounter: Payer: Self-pay | Admitting: Internal Medicine

## 2011-11-10 ENCOUNTER — Ambulatory Visit (INDEPENDENT_AMBULATORY_CARE_PROVIDER_SITE_OTHER): Payer: 59 | Admitting: Internal Medicine

## 2011-11-10 VITALS — BP 102/68 | HR 60 | Temp 98.4°F | Ht 69.0 in | Wt 171.5 lb

## 2011-11-10 DIAGNOSIS — J019 Acute sinusitis, unspecified: Secondary | ICD-10-CM | POA: Insufficient documentation

## 2011-11-10 DIAGNOSIS — Z0000 Encounter for general adult medical examination without abnormal findings: Secondary | ICD-10-CM | POA: Insufficient documentation

## 2011-11-10 MED ORDER — LEVOFLOXACIN 250 MG PO TABS: 250.0000 mg | ORAL_TABLET | Freq: Every day | ORAL | Status: AC

## 2011-11-10 MED ORDER — FLUCONAZOLE 150 MG PO TABS: ORAL_TABLET | ORAL | Status: AC

## 2011-11-10 NOTE — Progress Notes (Signed)
  Subjective:    Patient ID: Brooke Peters, female    DOB: [birth year], 35 y.o.   MRN: [id number]  HPI   Here with 3 days acute onset fever, right facial and teeth pain, pressure, general weakness and malaise, and greenish d/c, with slight ST, but little to no cough and Pt denies chest pain, increased sob or doe, wheezing, orthopnea, PND, increased LE swelling, palpitations, dizziness or syncope. Not pregnant, Pt denies chest pain, increased sob or doe, wheezing, orthopnea, PND, increased LE swelling, palpitations, dizziness or syncope.  Pt denies new neurological symptoms such as new headache, or facial or extremity weakness or numbness   Pt denies polydipsia, polyuria.  Sees [facility] and Dr Wende Crease as well Past Medical History  Diagnosis Date  . AMENORRHEA 10/18/2008  . ANXIETY 10/18/2008  . DEPRESSION 01/11/2008  . DIZZINESS 04/02/2008  . Headache 04/02/2008  . HEMORRHOID, EXTERNAL, THROMBOSED 08/29/2009  . HYPERTENSION 01/11/2008  . HYPOKALEMIA 04/22/2008  . HYPOTHYROIDISM 01/11/2008  . Palpitations 04/02/2008  . URI 10/18/2008   Past Surgical History  Procedure Date  . Tonsillectomy 1995  . S/p basal cell cancer     left temple area    reports that she has never smoked. She does not have any smokeless tobacco history on file. She reports that she drinks alcohol. She reports that she does not use illicit drugs. family history includes Diabetes in her maternal grandfather and maternal grandmother; Heart disease in her other; Hypertension in her other; and Hypothyroidism in her maternal grandmother. No Known Allergies No current outpatient prescriptions on file prior to visit.   Review of Systems All otherwise neg per pt  Objective:   Physical Exam BP 102/68  Pulse 60  Temp(Src) 98.4 F (36.9 C) (Oral)  Ht 5\' 9"  (1.753 m)  Wt 171 lb 8 oz (77.792 kg)  BMI 25.33 kg/m2  SpO2 99%  LMP 11/02/2011 Physical Exam  VS noted Constitutional: Pt appears well-developed and well-nourished.    HENT: Head: Normocephalic.  Right Ear: External ear normal.  Left Ear: External ear normal.  Bilat tm's mild erythema.  Sinus tender bilat.  Pharynx mild erythema Eyes: Conjunctivae and EOM are normal. Pupils are equal, round, and reactive to light.  Neck: Normal range of motion. Neck supple.  Cardiovascular: Normal rate and regular rhythm.   Pulmonary/Chest: Effort normal and breath sounds normal.  Neurological: Pt is alert. No cranial nerve deficit.  Skin: Skin is warm. No erythema.  Psychiatric: Pt behavior is normal. Thought content normal.     Assessment & Plan:

## 2011-11-10 NOTE — Assessment & Plan Note (Signed)
Mild to mod, for antibx course,  to f/u any worsening symptoms or concerns 

## 2011-11-10 NOTE — Patient Instructions (Signed)
Take all new medications as prescribed Continue all other medications as before You can also take Delsym OTC for cough, and/or Mucinex (or it's generic off brand) for congestion Please keep your appointments with your specialists as you have planned - Dr Lurene Shadow, and your GYN yearly

## 2011-11-24 ENCOUNTER — Other Ambulatory Visit: Payer: Self-pay | Admitting: Endocrinology

## 2011-11-24 DIAGNOSIS — E041 Nontoxic single thyroid nodule: Secondary | ICD-10-CM

## 2012-01-24 ENCOUNTER — Other Ambulatory Visit: Payer: 59

## 2012-02-21 ENCOUNTER — Other Ambulatory Visit: Payer: 59

## 2012-05-31 ENCOUNTER — Ambulatory Visit: Payer: BC Managed Care – PPO | Admitting: Physician Assistant

## 2012-05-31 VITALS — BP 116/78 | HR 71 | Temp 99.3°F | Resp 16 | Ht 68.75 in | Wt 170.0 lb

## 2012-05-31 DIAGNOSIS — R509 Fever, unspecified: Secondary | ICD-10-CM

## 2012-05-31 DIAGNOSIS — J029 Acute pharyngitis, unspecified: Secondary | ICD-10-CM

## 2012-05-31 DIAGNOSIS — J020 Streptococcal pharyngitis: Secondary | ICD-10-CM

## 2012-05-31 LAB — POCT CBC
Granulocyte percent: 84.7 %G — AB (ref 37–80)
HCT, POC: 42.1 % (ref 37.7–47.9)
Hemoglobin: 13.2 g/dL (ref 12.2–16.2)
Lymph, poc: 0.7 (ref 0.6–3.4)
MCH, POC: 30.1 pg (ref 27–31.2)
MCHC: 31.4 g/dL — AB (ref 31.8–35.4)
MCV: 95.9 fL (ref 80–97)
MID (cbc): 0.5 (ref 0–0.9)
MPV: 9.4 fL (ref 0–99.8)
POC Granulocyte: 6.9 (ref 2–6.9)
POC LYMPH PERCENT: 9.1 %L — AB (ref 10–50)
POC MID %: 6.2 %M (ref 0–12)
Platelet Count, POC: 133 10*3/uL — AB (ref 142–424)
RBC: 4.39 M/uL (ref 4.04–5.48)
RDW, POC: 11.9 %
WBC: 8.2 10*3/uL (ref 4.6–10.2)

## 2012-05-31 LAB — POCT RAPID STREP A (OFFICE): Rapid Strep A Screen: POSITIVE — AB

## 2012-05-31 MED ORDER — AMOXICILLIN 875 MG PO TABS: 875.0000 mg | ORAL_TABLET | Freq: Two times a day (BID) | ORAL | Status: AC

## 2012-05-31 NOTE — Progress Notes (Signed)
  Subjective:    Patient ID: Brooke Peters, female    DOB: 1976/04/12, 36 y.o.   MRN: [id number]  HPI 36 year old female presents with acute onset of sore throat 2 days ago. Describes as worsening and as "the worst sore throat ever." She has had fever that has been reduced a bit with Motrin but comes right back.  No cough, nasal congestion, otalgia, or rhinorrhea.  Has had some nausea but no vomiting.  History of strep infections when she was younger. Had a tonsillectomy in her 20's.     Review of Systems  All other systems reviewed and are negative.       Objective:   Physical Exam  Constitutional: She is oriented to person, place, and time. She appears well-developed and well-nourished.  HENT:  Head: Normocephalic and atraumatic.  Right Ear: Hearing, tympanic membrane, external ear and ear canal normal.  Left Ear: Hearing, tympanic membrane, external ear and ear canal normal.  Mouth/Throat: Uvula is midline and mucous membranes are normal. No oropharyngeal exudate (+ tonsillar erythema and swelling bilaterally).  Eyes: Conjunctivae are normal. Pupils are equal, round, and reactive to light.  Neck: Normal range of motion. Neck supple.  Cardiovascular: Normal rate, regular rhythm and normal heart sounds.   Pulmonary/Chest: Effort normal and breath sounds normal.  Lymphadenopathy:    She has no cervical adenopathy.  Neurological: She is alert and oriented to person, place, and time.  Psychiatric: She has a normal mood and affect. Her behavior is normal. Judgment and thought content normal.     Results for orders placed in visit on 05/31/12  POCT RAPID STREP A (OFFICE)      Component Value Range   Rapid Strep A Screen Positive (*) Negative  POCT CBC      Component Value Range   WBC 8.2  4.6 - 10.2 K/uL   Lymph, poc 0.7  0.6 - 3.4   POC LYMPH PERCENT 9.1 (*) 10 - 50 %L   MID (cbc) 0.5  0 - 0.9   POC MID % 6.2  0 - 12 %M   POC Granulocyte 6.9  2 - 6.9   Granulocyte percent  84.7 (*) 37 - 80 %G   RBC 4.39  4.04 - 5.48 M/uL   Hemoglobin 13.2  12.2 - 16.2 g/dL   HCT, POC 65.7  84.6 - 47.9 %   MCV 95.9  80 - 97 fL   MCH, POC 30.1  27 - 31.2 pg   MCHC 31.4 (*) 31.8 - 35.4 g/dL   RDW, POC 96.2     Platelet Count, POC 133 (*) 142 - 424 K/uL   MPV 9.4  0 - 99.8 fL   Tylenol 1000 mg given in the office.      Assessment & Plan:   1. Acute pharyngitis  Start amoxicillin 875 mg bid x 10 days Continue ibuprofen and tylenol as needed for fever. Patient declined pain medicine.  Increase fluids POCT rapid strep A, POCT CBC  2. Fever    3. Strep pharyngitis  amoxicillin (AMOXIL) 875 MG tablet

## 2012-08-10 ENCOUNTER — Other Ambulatory Visit: Payer: Self-pay | Admitting: Endocrinology

## 2012-08-10 DIAGNOSIS — T381X5A Adverse effect of thyroid hormones and substitutes, initial encounter: Secondary | ICD-10-CM

## 2012-10-30 ENCOUNTER — Other Ambulatory Visit: Payer: 59

## 2012-11-21 ENCOUNTER — Ambulatory Visit
Admission: RE | Admit: 2012-11-21 | Discharge: 2012-11-21 | Disposition: A | Discharge status: Home / Self Care | Payer: BC Managed Care – PPO | Source: Ambulatory Visit | Attending: Endocrinology | Admitting: Endocrinology

## 2012-11-21 DIAGNOSIS — T381X5A Adverse effect of thyroid hormones and substitutes, initial encounter: Secondary | ICD-10-CM

## 2012-11-22 ENCOUNTER — Other Ambulatory Visit: Payer: Self-pay | Admitting: Internal Medicine

## 2012-11-22 ENCOUNTER — Ambulatory Visit (INDEPENDENT_AMBULATORY_CARE_PROVIDER_SITE_OTHER): Payer: BC Managed Care – PPO | Admitting: Internal Medicine

## 2012-11-22 ENCOUNTER — Encounter: Payer: Self-pay | Admitting: Internal Medicine

## 2012-11-22 ENCOUNTER — Other Ambulatory Visit: Payer: BC Managed Care – PPO

## 2012-11-22 VITALS — BP 118/80 | HR 66 | Temp 97.1°F | Ht 70.0 in | Wt 176.0 lb

## 2012-11-22 DIAGNOSIS — F411 Generalized anxiety disorder: Secondary | ICD-10-CM

## 2012-11-22 DIAGNOSIS — T783XXA Angioneurotic edema, initial encounter: Secondary | ICD-10-CM

## 2012-11-22 DIAGNOSIS — T7840XA Allergy, unspecified, initial encounter: Secondary | ICD-10-CM | POA: Insufficient documentation

## 2012-11-22 DIAGNOSIS — R197 Diarrhea, unspecified: Secondary | ICD-10-CM

## 2012-11-22 DIAGNOSIS — K529 Noninfective gastroenteritis and colitis, unspecified: Secondary | ICD-10-CM | POA: Insufficient documentation

## 2012-11-22 MED ORDER — EPINEPHRINE 0.3 MG/0.3ML IJ DEVI: 0.3000 mg | Freq: Once | INTRAMUSCULAR | Status: AC

## 2012-11-22 NOTE — Patient Instructions (Addendum)
Take all new medications as prescribed - the epipen Continue all other medications as before, including the benadryl Please go to LAB in the [facility] for the blood and/or urine tests to be done today You will be contacted by phone if any changes need to be made immediately.  Otherwise, you will receive a letter about your results with an explanation, but please check with MyChart first. Please keep your appointments with your specialists as you have planned - Dr Lurene Shadow for the thyroid Thank you for enrolling in MyChart. Please follow the instructions below to securely access your online medical record. MyChart allows you to send messages to your doctor, view your test results, renew your prescriptions, schedule appointments, and more. To Log into MyChart, please go to [email or web address], and your Username is: [email or web address] Please call if you would like to be referred to Allergy at your convenience

## 2012-11-22 NOTE — Progress Notes (Signed)
  Subjective:    Patient ID: Brooke Peters, female    DOB: May 22, 1976, 37 y.o.   MRN: [id number]  HPI  Here to f/u, she herself is convinced based on food reduction trials on her own that she has mild allergy;  Has been trying the gluten free diet and feeling better as well.  Did have what sounds like angioedema tongue at [employer] last aug 2013, not clear if food related but might be dairy b/c recurred with ice cream the wk later, since then avoided most dairy and done better.  Has not seen allergy due to cost and insurance problem. Had a mild reaction with [place] pepperoni.  Asks for allergy panel.  No rash or wheezing or other rash. Has ongoing diarrhea.  Denies worsening depressive symptoms, suicidal ideation, or panic. Past Medical History  Diagnosis Date  . AMENORRHEA 10/18/2008  . ANXIETY 10/18/2008  . DEPRESSION 01/11/2008  . DIZZINESS 04/02/2008  . Headache 04/02/2008  . HEMORRHOID, EXTERNAL, THROMBOSED 08/29/2009  . HYPERTENSION 01/11/2008  . HYPOKALEMIA 04/22/2008  . HYPOTHYROIDISM 01/11/2008  . Palpitations 04/02/2008  . URI 10/18/2008   Past Surgical History  Procedure Date  . Tonsillectomy 1995  . S/p basal cell cancer     left temple area    reports that she has never smoked. She does not have any smokeless tobacco history on file. She reports that she drinks alcohol. She reports that she does not use illicit drugs. family history includes Diabetes in her maternal grandfather and maternal grandmother; Heart disease in her other; Hypertension in her other; and Hypothyroidism in her maternal grandmother. No Known Allergies Review of Systems  Constitutional: Negative for unexpected weight change, or unusual diaphoresis  HENT: Negative for tinnitus.   Eyes: Negative for photophobia and visual disturbance.  Respiratory: Negative for choking and stridor.   Gastrointestinal: Negative for vomiting and blood in stool.  Genitourinary: Negative for hematuria and decreased urine volume.    Musculoskeletal: Negative for acute joint swelling Skin: Negative for color change and wound.  Neurological: Negative for tremors and numbness other than noted  Psychiatric/Behavioral: Negative for decreased concentration or  hyperactivity.       Objective:   Physical Exam BP 118/80  Pulse 66  Temp 97.1 F (36.2 C) (Oral)  Ht 5\' 10"  (1.778 m)  Wt 176 lb (79.833 kg)  BMI 25.25 kg/m2  SpO2 99% VS noted,  Constitutional: Pt appears well-developed and well-nourished.  HENT: Head: NCAT.  Right Ear: External ear normal.  Left Ear: External ear normal.  Eyes: Conjunctivae and EOM are normal. Pupils are equal, round, and reactive to light.  Neck: Normal range of motion. Neck supple.  Cardiovascular: Normal rate and regular rhythm.   Pulmonary/Chest: Effort normal and breath sounds normal.  Abd:  Soft, NT, non-distended, + BS Neurological: Pt is alert. Not confused  Skin: Skin is warm. No erythema.  Psychiatric: Pt behavior is normal. Thought content normal.     Assessment & Plan:

## 2012-11-23 ENCOUNTER — Encounter: Payer: Self-pay | Admitting: Internal Medicine

## 2012-11-23 LAB — ALLERGY FULL AND FOOD SPECIFIC PROFILE
Apple: <0.1 kU/L
Aspergillus fumigatus, IgG: <0.1 kU/L
Bahia Grass: <0.1 kU/L
Box Elder IgE: <0.1 kU/L
Common Ragweed: <0.1 kU/L
D. farinae: <0.1 kU/L
Egg White IgE: <0.1 kU/L
Elm IgE: <0.1 kU/L
G005 Rye, Perennial: <0.1 kU/L
House Dust Hollister: <0.1 kU/L
Milk IgE: <0.1 kU/L
Oak: <0.1 kU/L
Orange: <0.1 kU/L
Peanut IgE: <0.1 kU/L
Plantain: <0.1 kU/L
Shrimp IgE: <0.1 kU/L
Sycamore Tree: <0.1 kU/L
Timothy Grass: <0.1 kU/L
Tomato IgE: <0.1 kU/L
Tuna IgE: <0.1 kU/L
Wheat IgE: <0.1 kU/L

## 2012-11-23 LAB — CELIAC DISEASE PANEL: Endomysial IgA: NEGATIVE

## 2012-11-25 ENCOUNTER — Encounter: Payer: Self-pay | Admitting: Internal Medicine

## 2012-11-25 NOTE — Assessment & Plan Note (Signed)
None today, but seems so by hx as pt is convinced, and apparently improved with benadryl, and reports family urges her to get epipen as well, for allergy panel per pt reqeust

## 2012-11-25 NOTE — Assessment & Plan Note (Signed)
For celiac panel as well,  to f/u any worsening symptoms or concerns

## 2012-11-25 NOTE — Assessment & Plan Note (Signed)
stable overall by history and exam, , and pt to continue medical treatment as before,  to f/u any worsening symptoms or concerns  

## 2012-12-30 ENCOUNTER — Other Ambulatory Visit: Payer: Self-pay

## 2013-09-20 ENCOUNTER — Other Ambulatory Visit: Payer: Self-pay

## 2013-10-09 ENCOUNTER — Ambulatory Visit (INDEPENDENT_AMBULATORY_CARE_PROVIDER_SITE_OTHER): Payer: BC Managed Care – PPO | Admitting: Internal Medicine

## 2013-10-09 ENCOUNTER — Encounter: Payer: Self-pay | Admitting: Internal Medicine

## 2013-10-09 VITALS — BP 120/80 | HR 72 | Temp 97.5°F | Wt 185.4 lb

## 2013-10-09 DIAGNOSIS — J019 Acute sinusitis, unspecified: Secondary | ICD-10-CM | POA: Insufficient documentation

## 2013-10-09 DIAGNOSIS — F3289 Other specified depressive episodes: Secondary | ICD-10-CM

## 2013-10-09 DIAGNOSIS — F329 Major depressive disorder, single episode, unspecified: Secondary | ICD-10-CM

## 2013-10-09 DIAGNOSIS — I10 Essential (primary) hypertension: Secondary | ICD-10-CM

## 2013-10-09 MED ORDER — LEVOFLOXACIN 500 MG PO TABS: 500.0000 mg | ORAL_TABLET | Freq: Every day | ORAL | Status: AC

## 2013-10-09 MED ORDER — FLUCONAZOLE 150 MG PO TABS: ORAL_TABLET | ORAL | Status: AC

## 2013-10-09 NOTE — Progress Notes (Signed)
Pre-visit discussion using our clinic review tool. No additional management support is needed unless otherwise documented below in the visit note.  

## 2013-10-09 NOTE — Assessment & Plan Note (Signed)
Mild to mod, for antibx course,  to f/u any worsening symptoms or concerns 

## 2013-10-09 NOTE — Patient Instructions (Signed)
Please take all new medication as prescribed Please continue all other medications as before Please have the pharmacy call with any other refills you may need.  Please remember to sign up for My Chart if you have not done so, as this will be important to you in the future with finding out test results, communicating by private email, and scheduling acute appointments online when needed.  

## 2013-10-09 NOTE — Assessment & Plan Note (Signed)
stable overall by history and exam, recent data reviewed with pt, and pt to continue medical treatment as before,  to f/u any worsening symptoms or concerns BP Readings from Last 3 Encounters:  10/09/13 120/80  11/22/12 118/80  05/31/12 116/78

## 2013-10-09 NOTE — Assessment & Plan Note (Signed)
stable overall by history and exam, and pt to continue medical treatment as before,  to f/u any worsening symptoms or concerns 

## 2013-10-09 NOTE — Progress Notes (Signed)
Subjective:    Patient ID: Jonny Ruiz, female    DOB: Apr 02, 1976, 37 y.o.   MRN: [id number]  HPI   Here with 2-3 days acute onset fever, facial pain, pressure, headache, general weakness and malaise, and greenish d/c, with mild ST and cough, but pt denies chest pain, wheezing, increased sob or doe, orthopnea, PND, increased LE swelling, palpitations, dizziness or syncope. Pt denies new neurological symptoms such as new headache, or facial or extremity weakness or numbness   Pt denies polydipsia, polyuria.  Denies worsening depressive symptoms, suicidal ideation, or panic; has ongoing anxiety, not increased recently.  Past Medical History  Diagnosis Date  . AMENORRHEA 10/18/2008  . ANXIETY 10/18/2008  . DEPRESSION 01/11/2008  . DIZZINESS 04/02/2008  . Headache(784.0) 04/02/2008  . HEMORRHOID, EXTERNAL, THROMBOSED 08/29/2009  . HYPERTENSION 01/11/2008  . HYPOKALEMIA 04/22/2008  . HYPOTHYROIDISM 01/11/2008  . Palpitations 04/02/2008  . URI 10/18/2008   Past Surgical History  Procedure Laterality Date  . Tonsillectomy  1995  . S/p basal cell cancer      left temple area    reports that she has never smoked. She does not have any smokeless tobacco history on file. She reports that she drinks alcohol. She reports that she does not use illicit drugs. family history includes Diabetes in her maternal grandfather and maternal grandmother; Heart disease in her other; Hypertension in her other; Hypothyroidism in her maternal grandmother. No Known Allergies Current Outpatient Prescriptions on File Prior to Visit  Medication Sig Dispense Refill  . Cholecalciferol (VITAMIN D3) 5000 UNITS TABS Take by mouth.      . EPINEPHrine (EPIPEN 2-PAK) 0.3 mg/0.3 mL DEVI Inject 0.3 mLs (0.3 mg total) into the muscle once.  2 Device  5  . Multiple Vitamin (MULTIVITAMIN) capsule Take 1 capsule by mouth daily.        . Omega-3 Fatty Acids (FISH OIL) 500 MG CAPS Take by mouth daily.        Marland Kitchen thyroid (ARMOUR) 30 MG  tablet 45 mg every morning and 30 mg every evening        No current facility-administered medications on file prior to visit.   Review of Systems  Constitutional: Negative for unexpected weight change, or unusual diaphoresis  HENT: Negative for tinnitus.   Eyes: Negative for photophobia and visual disturbance.  Respiratory: Negative for choking and stridor.   Gastrointestinal: Negative for vomiting and blood in stool.  Genitourinary: Negative for hematuria and decreased urine volume.  Musculoskeletal: Negative for acute joint swelling Skin: Negative for color change and wound.  Neurological: Negative for tremors and numbness other than noted  Psychiatric/Behavioral: Negative for decreased concentration or  hyperactivity.       Objective:   Physical Exam BP 120/80  Pulse 72  Temp(Src) 97.5 F (36.4 C) (Oral)  Wt 185 lb 6 oz (84.086 kg)  SpO2 99% VS noted, mild ill Constitutional: Pt appears well-developed and well-nourished.  HENT: Head: NCAT.  Right Ear: External ear normal.  Left Ear: External ear normal.  Bilat tm's with mild erythema.  Max sinus areas non tender.  Pharynx with mild erythema, no exudate Eyes: Conjunctivae and EOM are normal. Pupils are equal, round, and reactive to light.  Neck: Normal range of motion. Neck supple.  Cardiovascular: Normal rate and regular rhythm.   Pulmonary/Chest: Effort normal and breath sounds normal.  Neurological: Pt is alert. Not confused  Skin: Skin is warm. No erythema.  Psychiatric: Pt behavior is normal. Thought content normal. not depressed  affect    Assessment & Plan:

## 2013-11-22 ENCOUNTER — Other Ambulatory Visit: Payer: Self-pay | Admitting: Endocrinology

## 2013-11-22 DIAGNOSIS — E049 Nontoxic goiter, unspecified: Secondary | ICD-10-CM

## 2013-11-22 DIAGNOSIS — E041 Nontoxic single thyroid nodule: Secondary | ICD-10-CM

## 2013-12-14 IMAGING — US US SOFT TISSUE HEAD/NECK
1 series · 14 of 25 positions shown · non-contrast
Comparison: Ultrasound of the thyroid of 10/19/2010

CLINICAL DATA: Thyroid goiter, follow-up, on thyroid medication

THYROID ULTRASOUND
TECHNIQUE: Ultrasound examination of the thyroid gland and adjacent
soft tissues was performed.

[Series 1: us soft tissue head/neck · 0.07mm/px · 14 of 42 slices shown]
[im 1/42]
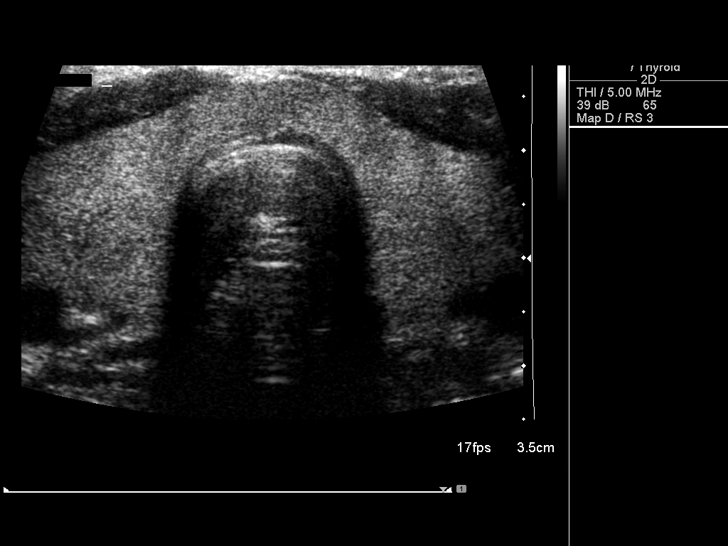
[im 4/42]
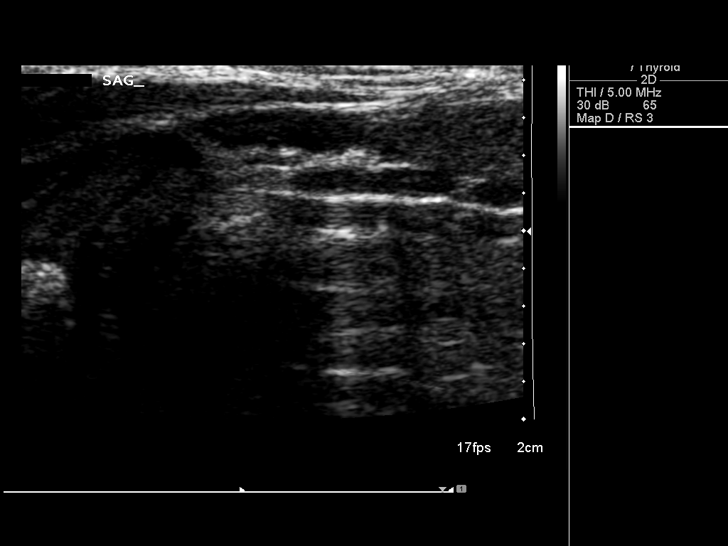
[im 7/42]
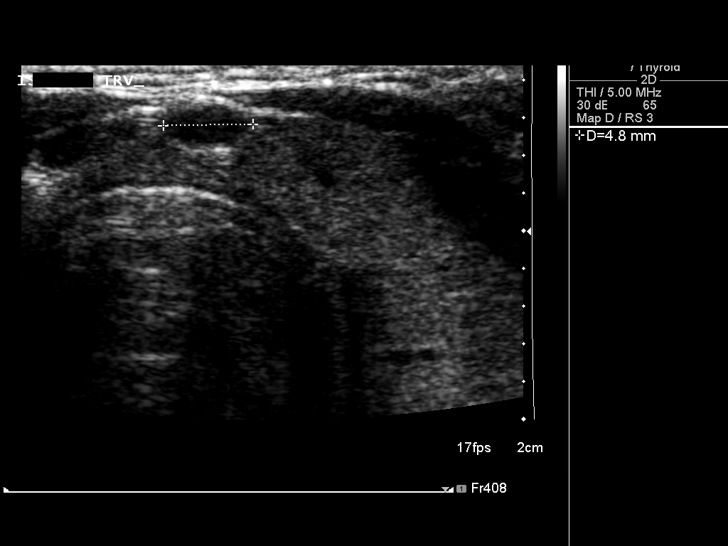
[im 11/42]
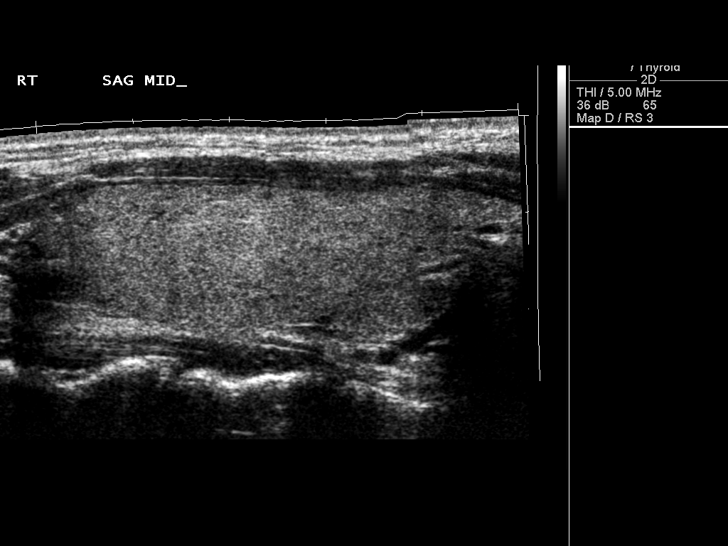
[im 14/42]
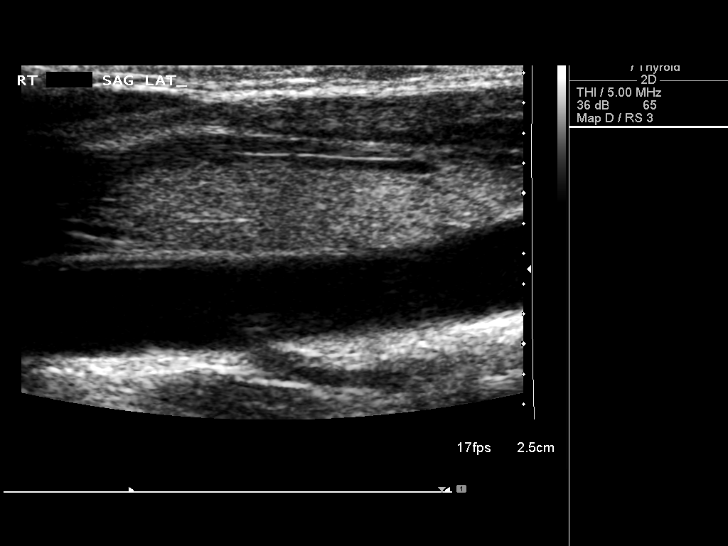
[im 16/42]
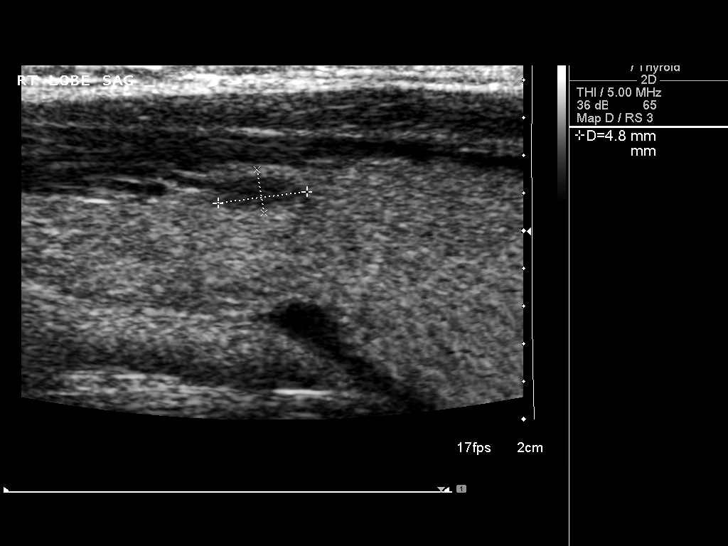
[im 19/42]
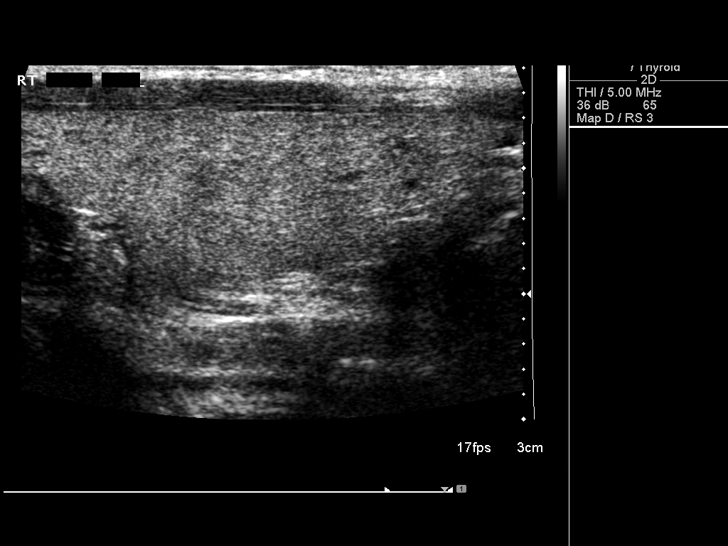
[im 23/42]
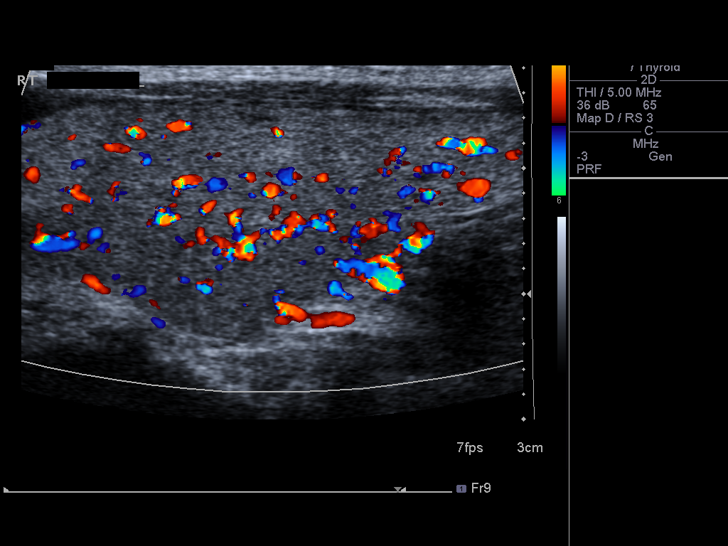
[im 26/42]
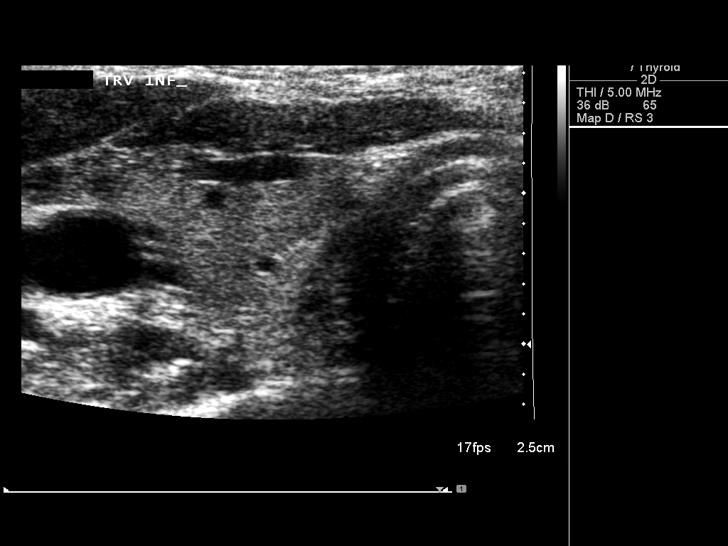
[im 28/42]
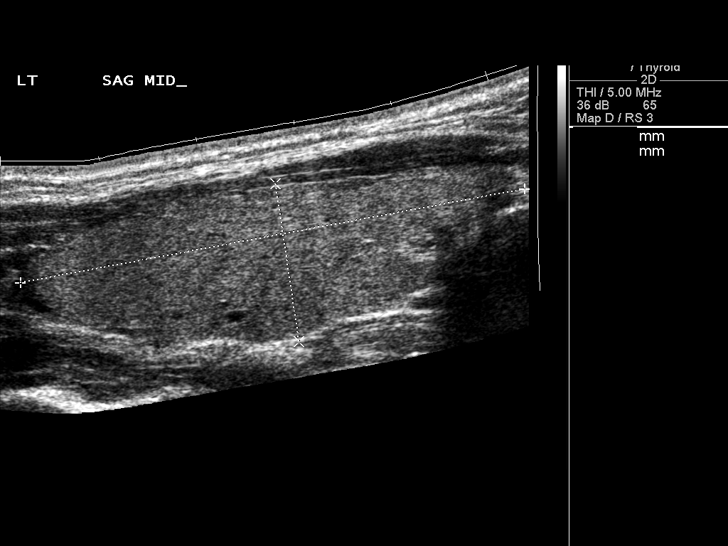
[im 31/42]
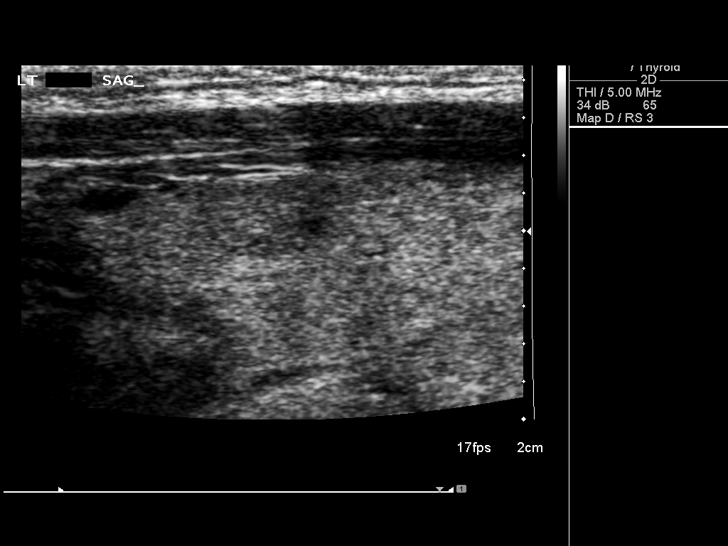
[im 35/42]
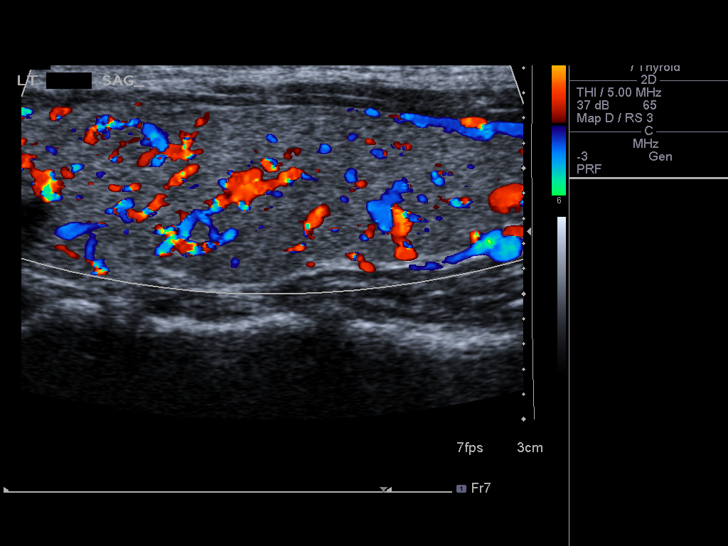
[im 38/42]
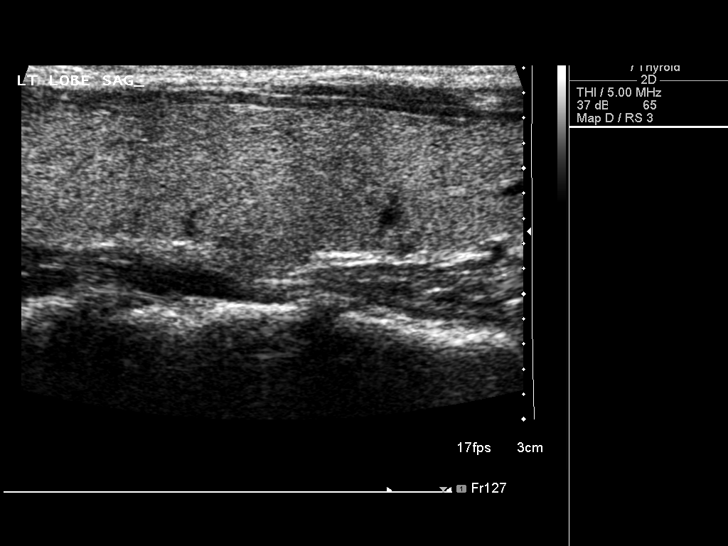
[im 42/42]
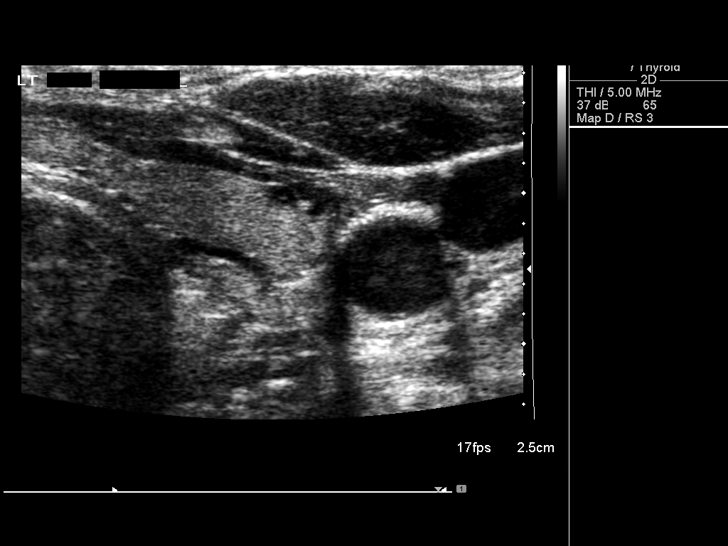

[14 of 25 positions shown; findings below may reference images not displayed]

FINDINGS: Right thyroid lobe:  5.3 x 1.7 x 1.9 cm.  (Previously 5.3 x 1.7 x
2.3 cm).
Left thyroid lobe:  5.2 x 1.6 x 1.7 cm.  (Previously 5.1 x 1.5 x
1.9 cm).
Isthmus:  5 mm in thickness.

Focal nodules:  The echogenicity of the thyroid gland is
inhomogeneous diffusely.  There are nodules bilaterally.  The
largest solid nodule is in the upper pole of the left lobe
measuring 8 x 5 x 6 mm. On the prior study this nodule measured 7 x
4 x 9 mm. Additional nodules are present bilaterally of no more
than 8 mm in maximum diameter.

Lymphadenopathy:  None visualized.
IMPRESSION: Stable bilateral thyroid nodules, none larger than 8 mm in
diameter.

## 2013-12-24 ENCOUNTER — Other Ambulatory Visit: Payer: BC Managed Care – PPO

## 2013-12-26 ENCOUNTER — Other Ambulatory Visit: Payer: BC Managed Care – PPO

## 2014-01-04 ENCOUNTER — Other Ambulatory Visit: Payer: BC Managed Care – PPO

## 2014-01-07 ENCOUNTER — Ambulatory Visit
Admission: RE | Admit: 2014-01-07 | Discharge: 2014-01-07 | Disposition: A | Discharge status: Home / Self Care | Payer: BC Managed Care – PPO | Source: Ambulatory Visit | Attending: Endocrinology | Admitting: Endocrinology

## 2014-01-07 DIAGNOSIS — E049 Nontoxic goiter, unspecified: Secondary | ICD-10-CM

## 2014-01-08 ENCOUNTER — Other Ambulatory Visit: Payer: BC Managed Care – PPO

## 2014-01-09 ENCOUNTER — Other Ambulatory Visit: Payer: BC Managed Care – PPO

## 2015-01-30 IMAGING — US US SOFT TISSUE HEAD/NECK
1 series · 13 of 25 positions shown · non-contrast
Comparison: 11/21/2012 and earlier studies

CLINICAL DATA: Goiter

EXAM:
THYROID ULTRASOUND
TECHNIQUE: Ultrasound examination of the thyroid gland and adjacent soft
tissues was performed.

[Series 1: us soft tissue head/neck · 0.09mm/px · 13 of 55 slices shown]
[im 1/55]
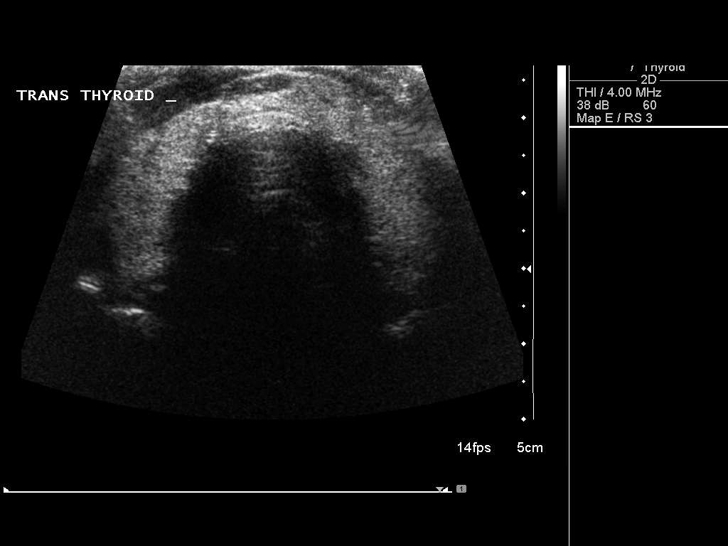
[im 5/55]
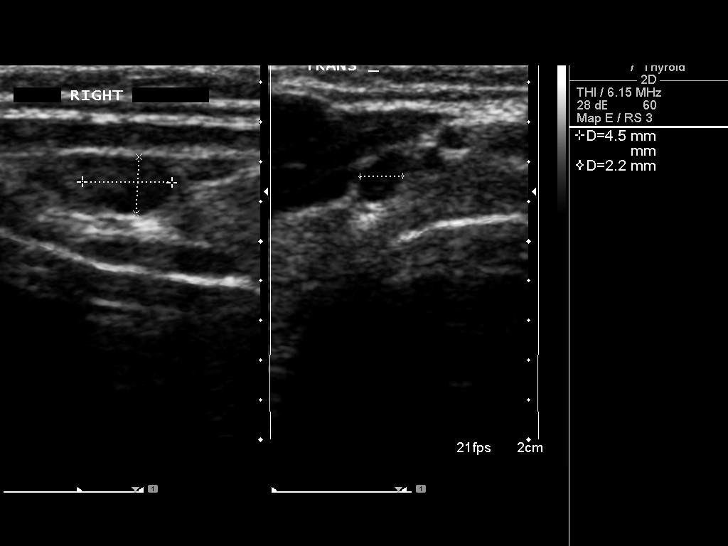
[im 10/55]
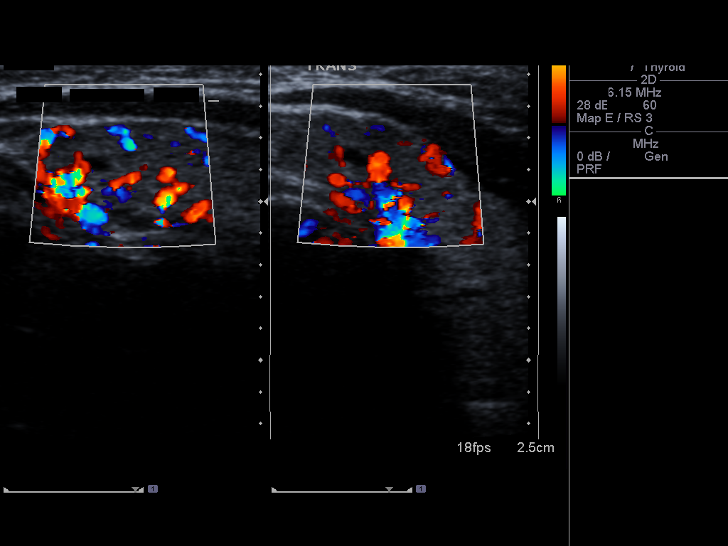
[im 14/55]
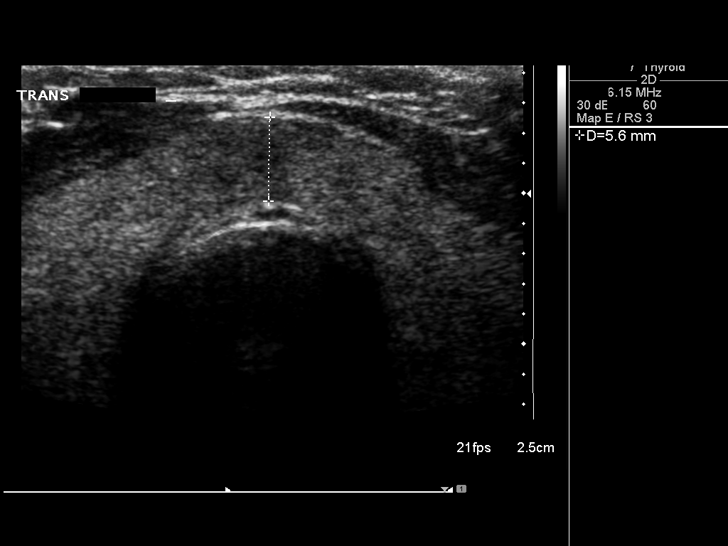
[im 19/55]
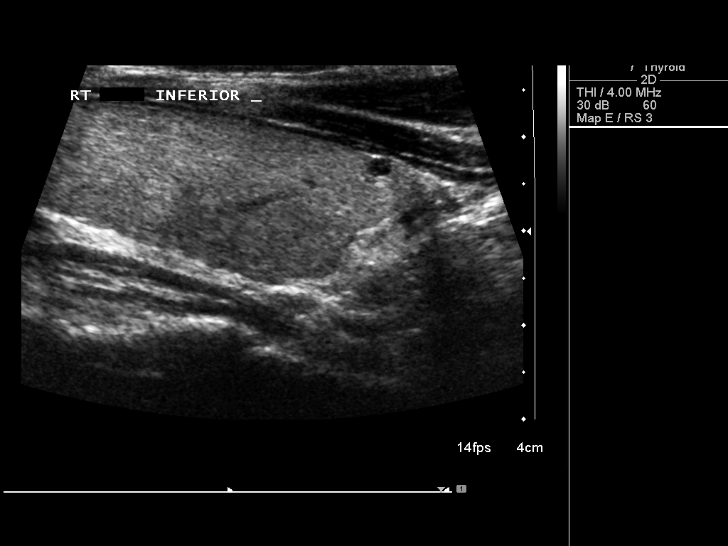
[im 23/55]
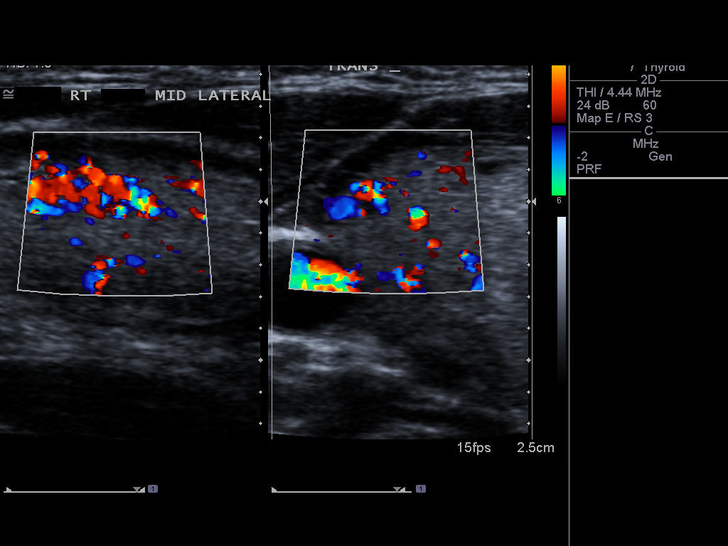
[im 28/55]
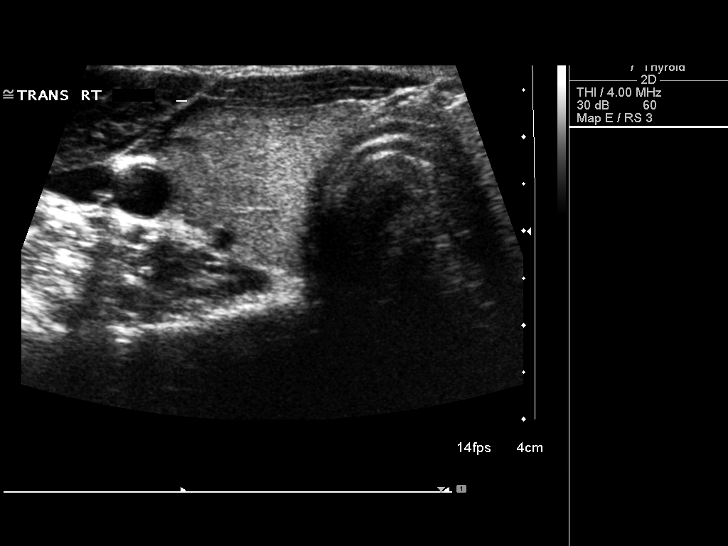
[im 32/55]
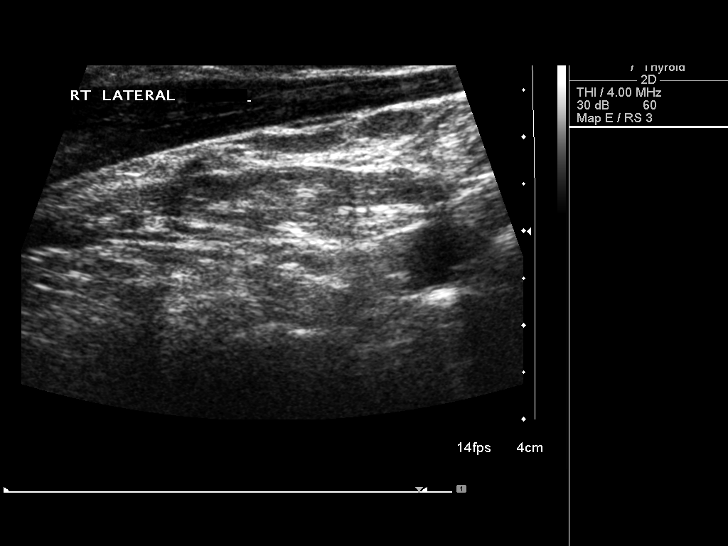
[im 37/55]
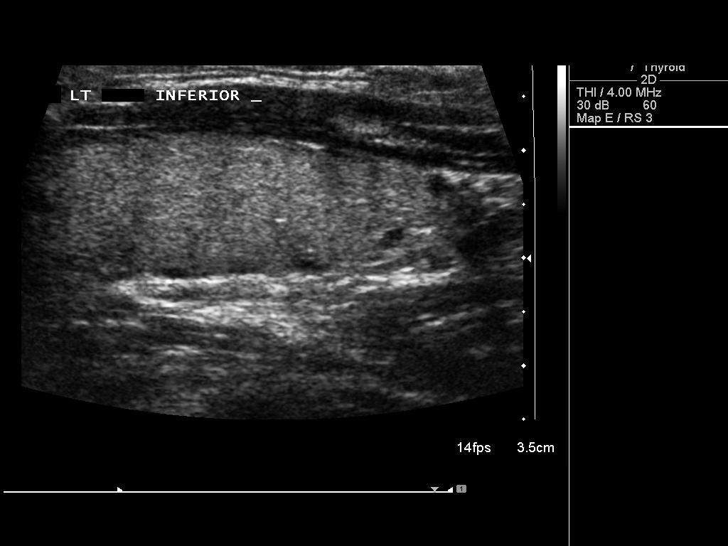
[im 41/55]
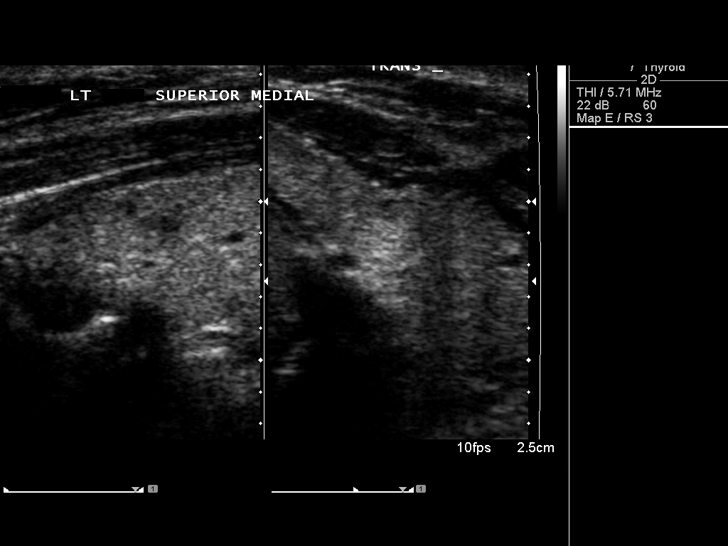
[im 46/55]
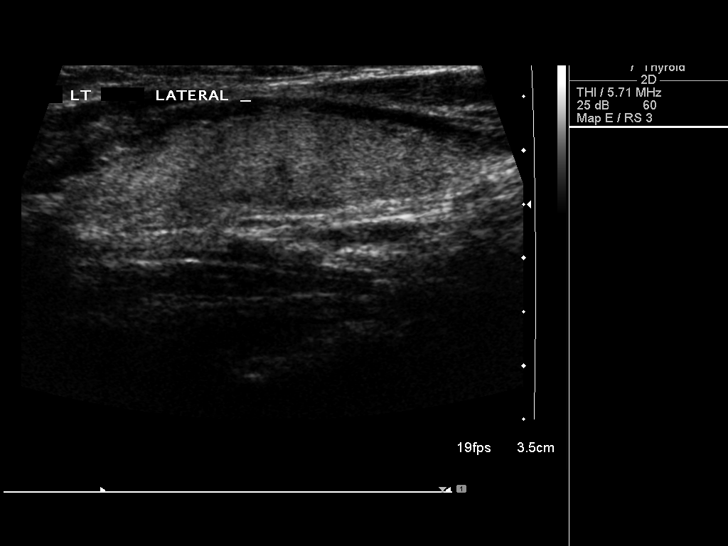
[im 50/55]
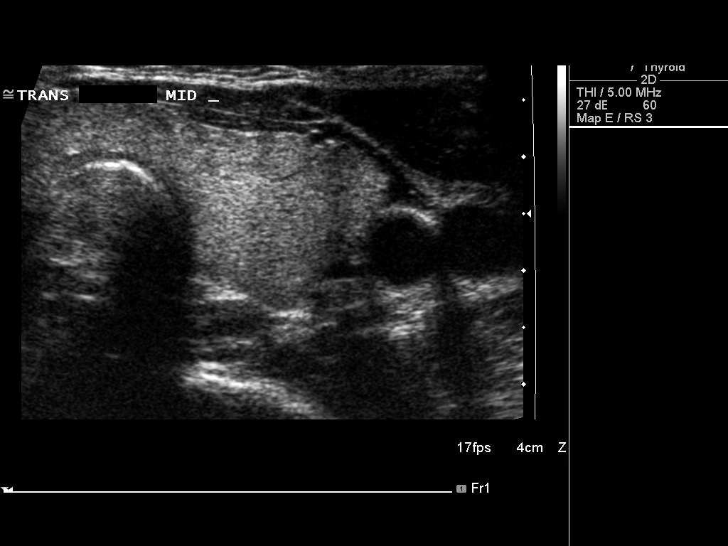
[im 55/55]
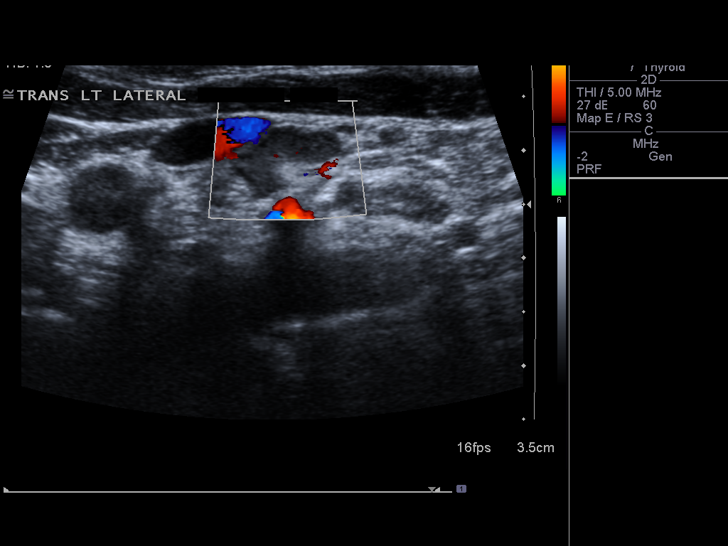

[13 of 25 positions shown; findings below may reference images not displayed]

FINDINGS: Right thyroid lobe

Measurements: 53 x 17 x 19 mm. Several small hypoechoic/cystic
lesions, largest 3 mm mid lobe.

Left thyroid lobe

Measurements: 49 x 15 x 18 mm. Several small hypoechoic/ cystic
lesions. Subtle 5 x 4 x 5 mm isoechoic nodular region in the
superior pole (previously 8 x 5 x 6) . Remainder of lesions measure
less than 3 mm.

Isthmus

Thickness: 5.6 mm. Several small hypoechoic/cystic nodules, largest
5 x 3 x 3 mm just to the right of midline.

Lymphadenopathy

None visualized.
IMPRESSION: 1. Normal-sized thyroid with no convincing increase in size of any
of the multiple small nodules. Findings do not meet current
consensus criteria for biopsy. Follow-up by clinical exam is
recommended. If patient has known risk factors for thyroid
carcinoma, consider follow-up ultrasound in 12 months. If patient is
clinically hyperthyroid, consider nuclear medicine thyroid uptake
and scan. This recommendation follows the consensus statement:
Management of Thyroid Nodules Detected as US: Society of
Radiologists in Ultrasound Consensus Conference Statement. Radiology
